# Patient Record
Sex: Male | Born: 1955 | Race: White | Hispanic: No | Marital: Married | Smoking: Never smoker
Health system: Southern US, Community
[De-identification: ages and names within clinical notes are randomized; demographics above are authoritative.]

## PROBLEM LIST (undated history)

## (undated) DIAGNOSIS — G4733 Obstructive sleep apnea (adult) (pediatric): Secondary | ICD-10-CM

## (undated) DIAGNOSIS — I2510 Atherosclerotic heart disease of native coronary artery without angina pectoris: Secondary | ICD-10-CM

## (undated) DIAGNOSIS — E7800 Pure hypercholesterolemia, unspecified: Secondary | ICD-10-CM

## (undated) DIAGNOSIS — G629 Polyneuropathy, unspecified: Secondary | ICD-10-CM

## (undated) DIAGNOSIS — I10 Essential (primary) hypertension: Secondary | ICD-10-CM

## (undated) DIAGNOSIS — I509 Heart failure, unspecified: Secondary | ICD-10-CM

## (undated) DIAGNOSIS — K219 Gastro-esophageal reflux disease without esophagitis: Secondary | ICD-10-CM

## (undated) HISTORY — PX: CORONARY ARTERY BYPASS GRAFT: SHX141

## (undated) HISTORY — PX: CARPAL TUNNEL RELEASE: SHX101

## (undated) HISTORY — PX: HAMMER TOE SURGERY: SHX385

## (undated) HISTORY — PX: TOE SURGERY: SHX1073

## (undated) HISTORY — DX: Polyneuropathy, unspecified: G62.9

## (undated) HISTORY — DX: Atherosclerotic heart disease of native coronary artery without angina pectoris: I25.10

## (undated) HISTORY — DX: Gastro-esophageal reflux disease without esophagitis: K21.9

## (undated) HISTORY — DX: Obstructive sleep apnea (adult) (pediatric): G47.33

## (undated) HISTORY — PX: PACEMAKER INSERTION: SHX728

---

## 1998-05-26 ENCOUNTER — Ambulatory Visit (HOSPITAL_BASED_OUTPATIENT_CLINIC_OR_DEPARTMENT_OTHER): Admission: RE | Admit: 1998-05-26 | Discharge: 1998-05-26 | Payer: Self-pay | Admitting: Orthopedic Surgery

## 1998-08-26 ENCOUNTER — Ambulatory Visit (HOSPITAL_COMMUNITY): Admission: RE | Admit: 1998-08-26 | Discharge: 1998-08-26 | Payer: Self-pay | Admitting: *Deleted

## 1998-09-28 ENCOUNTER — Ambulatory Visit (HOSPITAL_COMMUNITY): Admission: RE | Admit: 1998-09-28 | Discharge: 1998-09-28 | Payer: Self-pay | Admitting: Orthopedic Surgery

## 1998-09-28 ENCOUNTER — Encounter: Payer: Self-pay | Admitting: Orthopedic Surgery

## 1999-03-03 ENCOUNTER — Ambulatory Visit (HOSPITAL_BASED_OUTPATIENT_CLINIC_OR_DEPARTMENT_OTHER): Admission: RE | Admit: 1999-03-03 | Discharge: 1999-03-03 | Payer: Self-pay | Admitting: Orthopedic Surgery

## 1999-07-19 ENCOUNTER — Ambulatory Visit (HOSPITAL_BASED_OUTPATIENT_CLINIC_OR_DEPARTMENT_OTHER): Admission: RE | Admit: 1999-07-19 | Discharge: 1999-07-19 | Payer: Self-pay | Admitting: Orthopedic Surgery

## 1999-11-24 ENCOUNTER — Ambulatory Visit (HOSPITAL_COMMUNITY): Admission: RE | Admit: 1999-11-24 | Discharge: 1999-11-24 | Payer: Self-pay | Admitting: Family Medicine

## 2000-04-25 ENCOUNTER — Encounter: Admission: RE | Admit: 2000-04-25 | Discharge: 2000-07-24 | Payer: Self-pay | Admitting: Internal Medicine

## 2000-05-14 ENCOUNTER — Encounter: Admission: RE | Admit: 2000-05-14 | Discharge: 2000-08-12 | Payer: Self-pay | Admitting: *Deleted

## 2000-10-17 ENCOUNTER — Encounter: Admission: RE | Admit: 2000-10-17 | Discharge: 2000-10-18 | Payer: Self-pay | Admitting: Internal Medicine

## 2001-04-04 ENCOUNTER — Ambulatory Visit (HOSPITAL_BASED_OUTPATIENT_CLINIC_OR_DEPARTMENT_OTHER): Admission: RE | Admit: 2001-04-04 | Discharge: 2001-04-04 | Payer: Self-pay | Admitting: Orthopedic Surgery

## 2007-11-22 ENCOUNTER — Inpatient Hospital Stay (HOSPITAL_BASED_OUTPATIENT_CLINIC_OR_DEPARTMENT_OTHER): Admission: RE | Admit: 2007-11-22 | Discharge: 2007-11-22 | Payer: Self-pay | Admitting: Cardiology

## 2007-11-25 ENCOUNTER — Inpatient Hospital Stay (HOSPITAL_COMMUNITY): Admission: EM | Admit: 2007-11-25 | Discharge: 2007-12-02 | Payer: Self-pay | Admitting: Emergency Medicine

## 2007-11-26 ENCOUNTER — Ambulatory Visit: Payer: Self-pay | Admitting: Surgery

## 2007-11-26 ENCOUNTER — Encounter (INDEPENDENT_AMBULATORY_CARE_PROVIDER_SITE_OTHER): Payer: Self-pay | Admitting: Emergency Medicine

## 2007-11-26 ENCOUNTER — Encounter: Payer: Self-pay | Admitting: Cardiology

## 2007-12-17 ENCOUNTER — Encounter: Admission: RE | Admit: 2007-12-17 | Discharge: 2007-12-17 | Payer: Self-pay | Admitting: Surgery

## 2007-12-24 ENCOUNTER — Ambulatory Visit: Payer: Self-pay | Admitting: Surgery

## 2007-12-26 ENCOUNTER — Encounter (HOSPITAL_COMMUNITY): Admission: RE | Admit: 2007-12-26 | Discharge: 2008-03-25 | Payer: Self-pay | Admitting: Cardiology

## 2008-03-19 ENCOUNTER — Encounter: Payer: Self-pay | Admitting: Internal Medicine

## 2008-03-19 ENCOUNTER — Ambulatory Visit (HOSPITAL_COMMUNITY): Admission: RE | Admit: 2008-03-19 | Discharge: 2008-03-19 | Payer: Self-pay | Admitting: Family Medicine

## 2008-03-26 ENCOUNTER — Encounter (HOSPITAL_COMMUNITY): Admission: RE | Admit: 2008-03-26 | Discharge: 2008-03-28 | Payer: Self-pay | Admitting: Cardiology

## 2008-03-30 ENCOUNTER — Encounter (HOSPITAL_COMMUNITY): Admission: RE | Admit: 2008-03-30 | Discharge: 2008-06-18 | Payer: Self-pay | Admitting: Cardiology

## 2008-04-09 ENCOUNTER — Ambulatory Visit: Payer: Self-pay | Admitting: Internal Medicine

## 2008-04-09 DIAGNOSIS — R071 Chest pain on breathing: Secondary | ICD-10-CM

## 2008-04-09 DIAGNOSIS — R0602 Shortness of breath: Secondary | ICD-10-CM

## 2008-04-09 DIAGNOSIS — E785 Hyperlipidemia, unspecified: Secondary | ICD-10-CM | POA: Insufficient documentation

## 2008-04-09 DIAGNOSIS — I10 Essential (primary) hypertension: Secondary | ICD-10-CM | POA: Insufficient documentation

## 2008-04-17 ENCOUNTER — Ambulatory Visit: Payer: Self-pay | Admitting: Internal Medicine

## 2008-05-18 ENCOUNTER — Encounter: Payer: Self-pay | Admitting: Internal Medicine

## 2008-05-19 ENCOUNTER — Encounter: Payer: Self-pay | Admitting: Cardiology

## 2008-05-19 ENCOUNTER — Encounter: Payer: Self-pay | Admitting: Internal Medicine

## 2008-05-25 ENCOUNTER — Ambulatory Visit: Payer: Self-pay | Admitting: Internal Medicine

## 2008-05-25 DIAGNOSIS — R0609 Other forms of dyspnea: Secondary | ICD-10-CM | POA: Insufficient documentation

## 2008-05-25 DIAGNOSIS — R0989 Other specified symptoms and signs involving the circulatory and respiratory systems: Secondary | ICD-10-CM

## 2008-06-23 ENCOUNTER — Ambulatory Visit (HOSPITAL_BASED_OUTPATIENT_CLINIC_OR_DEPARTMENT_OTHER): Admission: RE | Admit: 2008-06-23 | Discharge: 2008-06-23 | Payer: Self-pay | Admitting: Pulmonary Disease

## 2008-06-23 ENCOUNTER — Ambulatory Visit: Payer: Self-pay | Admitting: Pulmonary Disease

## 2008-06-23 DIAGNOSIS — G4730 Sleep apnea, unspecified: Secondary | ICD-10-CM | POA: Insufficient documentation

## 2008-06-26 ENCOUNTER — Ambulatory Visit: Payer: Self-pay | Admitting: Pulmonary Disease

## 2008-07-30 ENCOUNTER — Encounter: Payer: Self-pay | Admitting: Pulmonary Disease

## 2008-08-07 ENCOUNTER — Telehealth (INDEPENDENT_AMBULATORY_CARE_PROVIDER_SITE_OTHER): Payer: Self-pay | Admitting: *Deleted

## 2008-09-21 ENCOUNTER — Encounter: Payer: Self-pay | Admitting: Internal Medicine

## 2008-10-26 ENCOUNTER — Telehealth (INDEPENDENT_AMBULATORY_CARE_PROVIDER_SITE_OTHER): Payer: Self-pay | Admitting: *Deleted

## 2008-11-18 ENCOUNTER — Ambulatory Visit: Payer: Self-pay | Admitting: Pulmonary Disease

## 2008-12-24 ENCOUNTER — Encounter: Payer: Self-pay | Admitting: Pulmonary Disease

## 2009-03-22 ENCOUNTER — Encounter: Payer: Self-pay | Admitting: Internal Medicine

## 2009-04-28 ENCOUNTER — Ambulatory Visit: Payer: Self-pay | Admitting: Surgery

## 2009-05-04 ENCOUNTER — Encounter: Admission: RE | Admit: 2009-05-04 | Discharge: 2009-05-04 | Payer: Self-pay | Admitting: Cardiology

## 2009-05-06 ENCOUNTER — Inpatient Hospital Stay (HOSPITAL_BASED_OUTPATIENT_CLINIC_OR_DEPARTMENT_OTHER): Admission: RE | Admit: 2009-05-06 | Discharge: 2009-05-06 | Payer: Self-pay | Admitting: Cardiology

## 2009-06-07 ENCOUNTER — Telehealth (INDEPENDENT_AMBULATORY_CARE_PROVIDER_SITE_OTHER): Payer: Self-pay | Admitting: *Deleted

## 2009-06-09 ENCOUNTER — Encounter: Payer: Self-pay | Admitting: Pulmonary Disease

## 2009-08-13 ENCOUNTER — Encounter: Payer: Self-pay | Admitting: Internal Medicine

## 2009-08-17 ENCOUNTER — Ambulatory Visit: Payer: Self-pay | Admitting: Pulmonary Disease

## 2009-10-19 ENCOUNTER — Ambulatory Visit: Payer: Self-pay | Admitting: Pulmonary Disease

## 2009-10-19 ENCOUNTER — Ambulatory Visit: Payer: Self-pay | Admitting: Internal Medicine

## 2009-10-19 DIAGNOSIS — R05 Cough: Secondary | ICD-10-CM

## 2009-10-19 DIAGNOSIS — R059 Cough, unspecified: Secondary | ICD-10-CM | POA: Insufficient documentation

## 2009-12-06 ENCOUNTER — Ambulatory Visit: Payer: Self-pay | Admitting: Internal Medicine

## 2009-12-06 DIAGNOSIS — I2510 Atherosclerotic heart disease of native coronary artery without angina pectoris: Secondary | ICD-10-CM | POA: Insufficient documentation

## 2010-01-17 ENCOUNTER — Encounter: Payer: Self-pay | Admitting: Pulmonary Disease

## 2010-01-21 ENCOUNTER — Encounter: Payer: Self-pay | Admitting: Pulmonary Disease

## 2010-02-02 ENCOUNTER — Encounter (HOSPITAL_COMMUNITY): Admission: RE | Admit: 2010-02-02 | Discharge: 2010-03-18 | Payer: Self-pay | Admitting: Internal Medicine

## 2010-03-19 ENCOUNTER — Encounter (HOSPITAL_COMMUNITY): Admission: RE | Admit: 2010-03-19 | Discharge: 2010-05-18 | Payer: Self-pay | Admitting: Internal Medicine

## 2010-03-21 ENCOUNTER — Ambulatory Visit: Payer: Self-pay | Admitting: Internal Medicine

## 2010-03-22 ENCOUNTER — Encounter: Payer: Self-pay | Admitting: Internal Medicine

## 2010-07-19 NOTE — Assessment & Plan Note (Signed)
Summary: f/u ///kp   Visit Type:  Follow-up Copy to:  Dr. Durwin Burns Primary Provider/Referring Provider:  Dr. Holley Burns PCP, Dr. Laneta Burns CVTS, Dr. Peter Burns Cardiology  CC:  The patient is here for follow-up and says there is no changes in his breathing ....  History of Present Illness: 55/M with metabolic syndrome & moderate obstructive sleep apnea, DM, HTN, CAD s/p CABG x 5 in 6/09.   : IOV 04/09/2008: 55 year old non-smoker male with diabetes, obesity, hypertension, hyperlipidemia. Had CABG x 5 in  June 2009. Subsequently, c/o "hurting to breathe" even the immediate post-op period. However, this resolved a few weeks after surgery but recurred few months ago. Symptom is significant enough he could not complete cardiac rehab yesterday; currently in maintenance phase. Describes a central chest discomfort that is present all the time, including waking him up at night. Worsened by deep inspiration.Relieved by "shallow breathing". Does not radiate. No other trigger or relieving factors including symbicort/advair each for 3 weeks without relief. Currently on spiriva since 04/07/2008.  Symptoms are associated with on and off dyspnea with exertion; notices this in rehab but does not desaturate. Of note, he has not seen Dr. Laneta Burns or Dr. Swaziland about this.  Reports having had pft's a few weeks ago at [facility] and was told he had mild copd. I reviewd this Jordan Burns done on 03/19/2008 and it is essential normal but for redn in [name] - ? CABG effect; Fev1 2.82/80%, FVC 3.6/73%, Ratio 78 (77), Fef 25-75% 2.18/64%, flow-volume loop normal. REC: CXR, Full pft and after stopping all inhalers  OV 57/12/2007: S: Here to review tests ordered last visit. Reports symptoms are better since last visit. Has seen Dr. Swaziland last week who reportely told him it was "body and heart readjusting after CABG and was reassrred". He is leaning towards expectantly following this. Also, he is admitting today that he snores a lot  and has xs daytime somnolence and has weird sleep schedule (wakes up at 1am) due to job. He is amenable for sleep eval O: CXR 10/22//2009  - normal. PFTs normal on 04/17/2008. Had - walked 456 meters wihthout desaturation. Dyspnea Score was 3 at baseline and end of test.   OV Dr Jordan Burns 57/06/2009: Moderate OSA. RDI 14. Started CPAP.        Oct 19, 2009--Presents for work in visit. Complains over last 6 months of increased SOB, occ wheezing, dry cough. Dry cough is aggravating, tickling cough. Breathing is not as good, wears out easily. Has a whistling sound in throat esp at night. Denies chest pain,  orthopnea, hemoptysis, fever, n/v/d, edema, headache.     REC:  CXR - clear Jordan Burns - shows restriction. Fev1 2.27L/57%, FVC 2.74L/53%, Ratio 82 Change Lisinopril to ARB, STop Fish ol.  OV 12/06/2009: Dyspnea followup. NOt seen him since dec 2009. States that dyspnea severity has progressed esp past 1 year. DYspnea poses a 'constant struggle". Makes him "heck a lot tired". STates any exertion makes him dyspneic; esp trying to [place] or walking. He does have associated chest "hiurting" and also states that wife has noted him to have wheezed on occassion. Dr Burns did evaluate him for angina equivalent in Feb 2011. 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 Stil has cough but this is mild-moderate and stable. It comes and goes at random.  Present for > 6 months. Changing ACE inhbitior to ARB  and stopping fish oil have not  helped. No other symptoms other than edema.  REC: WEIGHT LOSS, STRICT DIET, PULMONARY REHAB       March 21, 2010: Followup dyspnea due to obesity ad angina equivalent.  Since last visit in june 2011 he has lost 11# via diet. Has been attending pulmonary rehab and dyspnea is resolved. FEels well. going for walks.  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 No cough. No new problems.   Preventive Screening-Counseling & Management  Alcohol-Tobacco     Smoking Status: never  Current Medications (verified): 1)  Metformin Hcl 1000 Mg Tabs (Metformin Hcl) .... Take 1 Tablet By Mouth Two Times A Day 2)  Metoprolol Succinate 50 Mg Xr24h-Tab (Metoprolol Succinate) .... Once Daily 3)  Simvastatin 80 Mg Tabs (Simvastatin) .... Once Daily 4)  Lantus 100 Unit/ml Soln (Insulin Glargine) .... 76 Units Two Times A Day 5)  Novolog 100 Unit/ml Soln (Insulin Aspart) .... Sliding Scale With Each Meal 6)  Furosemide 40 Mg Tabs (Furosemide) .... Take 1 Tablet By Mouth Once A Day 7)  Isosorbide Mononitrate Cr 60 Mg Xr24h-Tab (Isosorbide Mononitrate) .... Take 1 Tablet By Mouth Two Times A Day 8)  Fish Oil 1000 Mg Caps (Omega-3 Fatty Acids) .... Take 1 Capsule By Mouth Three Times A Day 9)  Nitrostat 0.4 Mg Subl (Nitroglycerin) .Marland Kitchen.. 1 Under Tongue Every 5 Minutes X3 As Needed Chest Pain 10)  Losartan Potassium 100 Mg Tabs (Losartan Potassium) .Marland Kitchen.. 1 By Mouth Daily 11)  Buspirone Hcl 10 Mg Tabs (Buspirone Hcl) .Marland Kitchen.. 1 By Mouth Three Times A Day 12)  Bupropion Hcl 100 Mg Xr12h-Tab (Bupropion Hcl) .... 2 By Mouth Two Times A Day 13)  Multivitamins  Tabs (Multiple Vitamin) .Marland Kitchen.. 1 By Mouth Daily  Allergies (verified): 1)  ! Iodine  Past History:  Past medical, surgical, family and social histories (including risk factors) reviewed, and no changes noted (except as noted below).  Past Medical History: Reviewed history from 06/23/2008 and no changes required. Diabetes Hyperlipidemia Hypertension CAD - sp CABG June 2009 Mild GERD - currently off medicines Obesity - BMI 32  Past Surgical History: Reviewed history from 04/09/2008 and no changes required. CABG x 5 - June 2009  Family History: Reviewed history from 04/09/2008 and no changes required. heart disease- mother and father  Social History: Reviewed history from 06/23/2008 and  no changes required. 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    Vital Signs:  Patient profile:   55 year old male Height:      72 inches (182.88 cm) Weight:      272 pounds (123.64 kg) BMI:     37.02 O2 Sat:      95 % on Room air Temp:     97.8 degrees F (36.56 degrees C) oral Pulse rate:   66 / minute  BP sitting:   112 / 70  (left arm) Cuff size:   large  O2 Sat at Rest %:  95 O2 Flow:  Room air CC: The patient is here for follow-up and says there is no changes in his breathing ... Is Patient Diabetic? Yes Comments Medications reviewed with patient Daytime phone verified. Michel Bickers [facility]  March 21, 2010 1:44 PM   Physical Exam  General:  obese.  well developed, well nourished, in no acute distress Head:  normocephalic and atraumatic Eyes:  PERRLA/EOM intact; conjunctiva and sclera clear Ears:  TMs intact and clear with normal canals Nose:  no deformity, discharge, inflammation, or lesions Mouth:  no deformity or lesions Neck:  no masses, thyromegaly, or abnormal cervical nodes Chest Wall:  scar of CABG + Lungs:  clear bilaterally to auscultation and percussion Heart:  regular rate and rhythm, S1, S2 without murmurs, rubs, gallops, or  clicks Abdomen:  bowel sounds positive; abdomen soft and non-tender without masses, or organomegaly Msk:  no deformity or scoliosis noted with normal posture Pulses:  pulses normal Extremities:  no clubbing, cyanosis, edema, or deformity noted Neurologic:  CN II-XII grossly intact with normal reflexes, coordination, muscle strength and tone Skin:  intact without lesions or rashes Cervical Nodes:  no significant adenopathy Axillary Nodes:  no significant adenopathy Psych:  alert and cooperative; normal mood and affect; normal attention span and concentration   Impression & Recommendations:  Problem # 1:  COUGH (ICD-786.2) Assessment Improved  now resolved  plan monitor  Orders: Est. Patient Level II (16109)  Problem # 2:  SHORTNESS OF BREATH (SOB) (ICD-786.05) Assessment: Improved  Due to angina equivalent, obesity and deconditioning. He is better after 11# weight loss and attending pulmonary rehab. Encouraged to follow near vegan diet and continued weight loss. Advised to get personal fitness  trainer if he can afford it. FU 1 year  Orders: Est. Patient Level II (60454)  Medications Added to Medication List This Visit: 1)  Lantus 100 Unit/ml Soln (Insulin glargine) .... 76 units two times a day 2)  Losartan Potassium 100 Mg Tabs (Losartan potassium) .Marland Kitchen.. 1 by mouth daily 3)  Buspirone Hcl 10 Mg Tabs (Buspirone hcl) .Marland Kitchen.. 1 by mouth three times a day 4)  Bupropion Hcl 100 Mg Xr12h-tab (Bupropion hcl) .... 2 by mouth two times a day 5)  Multivitamins Tabs (Multiple vitamin) .Marland Kitchen.. 1 by mouth daily  Patient Instructions: 1)  followup 1 year 2)  if you can afford it get a fitness /nutrition trainer and keep going on with weight loss 3)  come sooner if there are problems   Immunization History:  Influenza Immunization History:    Influenza:  historical (03/14/2010)

## 2010-07-19 NOTE — Letter (Signed)
Summary: [facility]   Imported By: Sherian Rein 09/16/2009 07:06:38  _____________________________________________________________________  External Attachment:    Type:   Image     Comment:   External Document

## 2010-07-19 NOTE — Letter (Signed)
Summary: [facility]N/[facility]  LMN/[facility]   Imported By: Lester [place] 01/21/2010 14:25:39  _____________________________________________________________________  External Attachment:    Type:   Image     Comment:   External Document

## 2010-07-19 NOTE — Assessment & Plan Note (Signed)
Summary: 1 MONTH/ MBW   Visit Type:  Follow-up Copy to:  Dr. Durwin Nora Primary Provider/Referring Provider:  [name] PCP, [name] CVTS, [name] Cardiology  CC:  1 month followup per TP.  Pt states that his breathing is worse since last seen.  He states that he gets SOB with or without any exertion.  Cough is better- still has occ dry cough- mainly in the am.  He states that he took 1 month supply of diovan and this did not help so went back on lisinopril.  Jordan Burns  History of Present Illness: 55/M with metabolic syndrome & moderate obstructive sleep apnea, DM, HTN, CAD s/p CABG x 5 in 6/09.   : IOV 04/09/2008: 55 year old non-smoker male with diabetes, obesity, hypertension, hyperlipidemia. Had CABG x 5 in  June 2009. Subsequently, c/o "hurting to breathe" even the immediate post-op period. However, this resolved a few weeks after surgery but recurred few months ago. Symptom is significant enough he could not complete cardiac rehab yesterday; currently in maintenance phase. Describes a central chest discomfort that is present all the time, including waking him up at night. Worsened by deep inspiration.Relieved by "shallow breathing". Does not radiate. No other trigger or relieving factors including symbicort/advair each for 3 weeks without relief. Currently on spiriva since 04/07/2008.  Symptoms are associated with on and off dyspnea with exertion; notices this in rehab but does not desaturate. Of note, he has not seen [name] or Dr. Sw[place] about this.  Reports having had pft's a few weeks ago at [facility] and was told he had mild copd. I reviewd this [name] done on 03/19/2008 and it is essential normal but for redn in [facility] - ? CABG effect; Fev1 2.82/80%, FVC 3.6/73%, Ratio 78 (77), Fef 25-75% 2.18/64%, flow-volume loop normal. REC: CXR, Full pft and after stopping all inhalers  OV 05/25/2008: S: Here to review tests ordered last visit. Reports symptoms are better since last visit.  Has seen Dr. Swaziland last week who reportely told him it was "body and heart readjusting after CABG and was reassrred". He is leaning towards expectantly following this. Also, he is admitting today that he snores a lot and has xs daytime somnolence and has weird sleep schedule (wakes up at 1am) due to job. He is amenable for sleep eval O: CXR 10/22//2009  - normal. PFTs normal on 04/17/2008. Had - walked 456 meters wihthout desaturation. Dyspnea Score was 3 at baseline and end of test.   OV [name] 08/17/2009: Moderate OSA. RDI 14. Started CPAP.        Oct 19, 2009--Presents for work in visit. Complains over last 6 months of increased SOB, occ wheezing, dry cough. Dry cough is aggravating, tickling cough. Breathing is not as good, wears out easily. Has a whistling sound in throat esp at night. Denies chest pain,  orthopnea, hemoptysis, fever, n/v/d, edema, headache.     REC:  CXR - clear [name] - shows restriction. Fev1 2.27L/57%, FVC 2.74L/53%, Ratio 82 Change Lisinopril to ARB, STop Fish ol.  OV 12/06/2009: Dyspnea followup. NOt seen him since dec 2009. States that dyspnea severity has progressed esp past 1 year. DYspnea poses a 'constant struggle". Makes him "heck a lot tired". STates any exertion makes him dyspneic; esp trying to [place] or walking. He does have associated chest "hiurting" and also states that wife has noted him to have wheezed on occassion. [name] did evaluate him for angina equivalent  in Feb 2011. 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 Stil has cough but this is mild-moderate and stable. It comes and goes at random. Present for > 6 months. Changing ACE inhbitior to ARB  and stopping fish oil have not  helped. No other symptoms other than edema.     "  Preventive Screening-Counseling &  Management  Alcohol-Tobacco     Smoking Status: never  Current Medications (verified): 1)  Metformin Hcl 1000 Mg Tabs (Metformin Hcl) .... Take 1 Tablet By Mouth Two Times A Day 2)  Sertraline Hcl 100 Mg Tabs (Sertraline Hcl) .... Once Daily 3)  Metoprolol Succinate 50 Mg Xr24h-Tab (Metoprolol Succinate) .... Once Daily 4)  Simvastatin 80 Mg Tabs (Simvastatin) .... Once Daily 5)  Lisinopril 10 Mg Tabs (Lisinopril) .Jordan Burns.. 1 Once Daily 6)  Vitamin B-12 1000 Mcg Tabs (Cyanocobalamin) .... Once Daily 7)  Lantus 100 Unit/ml Soln (Insulin Glargine) .... 74 Units A Day 8)  Novolog 100 Unit/ml Soln (Insulin Aspart) .... Sliding Scale With Each Meal 9)  Furosemide 40 Mg Tabs (Furosemide) .... Take 1 Tablet By Mouth Once A Day 10)  Isosorbide Mononitrate Cr 60 Mg Xr24h-Tab (Isosorbide Mononitrate) .... Take 1 Tablet By Mouth Two Times A Day 11)  Fish Oil 1000 Mg Caps (Omega-3 Fatty Acids) .... Take 1 Capsule By Mouth Three Times A Day 12)  Nitrostat 0.4 Mg Subl (Nitroglycerin) .Jordan Burns.. 1 Under Tongue Every 5 Minutes X3 As Needed Chest Pain  Allergies (verified): 1)  ! 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 Diabetes Hyperlipidemia Hypertension CAD - sp CABG June 2009 Mild GERD - currently off medicines Obesity - BMI 32  Past Surgical History: Reviewed history from 04/09/2008 and no changes required. CABG x 5 - June 2009  Family History: Reviewed history from 04/09/2008 and no changes required. heart  disease- mother and father  Social History: Reviewed history from 06/23/2008 and no changes required. 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  The patient denies productive cough, coughing up blood, irregular heartbeats, acid heartburn, indigestion, loss of appetite, weight change, abdominal pain, difficulty swallowing, sore throat, tooth/dental problems, headaches, nasal congestion/difficulty breathing through nose, sneezing, itching, ear ache, anxiety, depression, rash, change in color of mucus, and fever.    Vital Signs:  Patient profile:   55 year old male Weight:      283 pounds BMI:     38.52 O2 Sat:      96 % on Room air Temp:     97.4 degrees F oral Pulse rate:   69 /  minute BP sitting:   110 / 68  (left arm)  Vitals Entered By: Vernie Murders (December 06, 2009 9:53 AM)  O2 Flow:  Room air  Physical Exam  General:  obese.  well developed, well nourished, in no acute distress Head:  normocephalic and atraumatic Eyes:  PERRLA/EOM intact; conjunctiva and sclera clear Ears:  TMs intact and clear with normal canals Nose:  no deformity, discharge, inflammation, or lesions Mouth:  no deformity or lesions Neck:  no masses, thyromegaly, or abnormal cervical nodes Chest Wall:  scar of CABG + Lungs:  clear bilaterally to auscultation and percussion Heart:  regular rate and rhythm, S1, S2 without murmurs, rubs, gallops, or clicks Abdomen:  bowel sounds positive; abdomen soft and non-tender without masses, or organomegaly Msk:  no deformity or scoliosis noted with normal  posture Pulses:  pulses normal Extremities:  no clubbing, cyanosis, edema, or deformity noted Neurologic:  CN II-XII grossly intact with normal reflexes, coordination, muscle strength and tone Skin:  intact without lesions or rashes Cervical Nodes:  no significant adenopathy Axillary Nodes:  no significant adenopathy Psych:  alert and cooperative; normal mood and affect; normal attention span and concentration   Impression & Recommendations:  Problem # 1:  SHORTNESS OF BREATH (SOB) (ICD-786.05) Assessment Deteriorated Pulmonary workup so fare negative. CArdiac workup suggests angina equivalent as etiology for dyspnea. Obesity could be playig a role as well. I do not see utility in getting CPST. He needs to lose weight. REcommened following Danielle Dess and Jackolyn Confer diets to help lose weight and potentially reverse atherosclerosis. REferrred him to rehab. There is very little else to offer as of now. I will see him 4-5 months to monitor progress Orders: Rehabilitation Referral (Rehab) Est. Patient Level III (04540)  Problem # 2:  COUGH (ICD-786.2) Assessment: Unchanged  Chaning ACE inhbitor to ARB and stopping fish oil have not helped with cough. GERD is likely scenarior. In any wevent cough is mild. Advised GERD control. Will reassess at fu.   Orders: Est. Patient Level III ([id number])  Problem # 3:  OBSTRUCTIVE SLEEP APNEA (ICD-780.57) Assessment: Comment Only per [name] Needs to lose weight Orders: Rehabilitation Referral (Rehab) Est. Patient Level III ([id number])  Medications Added to Medication List This Visit: 1)  Lisinopril 10 Mg Tabs (Lisinopril) .Jordan Burns.. 1 once daily  Patient Instructions: 1)  attend pulmonary rehab for shortness of breath 2)  read Danielle Dess and Luster Landsberg diets 3)  Join weight watchers 4)  I think it is your heart and weight causing shortness of breath 5)  return in 4-5 months to report progress 6)  you really really need to get serious about  your diet and weight

## 2010-07-19 NOTE — Letter (Signed)
Summary: [facility]  CMN/[facility]   Imported By: Lester Alice 01/27/2010 08:24:50  _____________________________________________________________________  External Attachment:    Type:   Image     Comment:   External Document

## 2010-07-19 NOTE — Assessment & Plan Note (Signed)
Summary: Acute NP office visit - dyspnea   Copy to:  Dr. Durwin Burns Primary Provider/Referring Provider:  Dr. Holley Burns PCP, Dr. Laneta Burns CVTS, Dr. Peter Burns Cardiology  CC:  increased SOB, occ wheezing, dry cough x63months - denies chest congestion, head congestion, and f/c/s.  History of Present Illness: 53/M with metabolic syndrome & moderate obstructive sleep apnea, DM, HTN, CAD s/p CABG x 5 in 6/09.   : IOV 04/09/2008: 55 year old non-smoker male with diabetes, hypertension, hyperlipidemia. Had CABG x 5 in  June 2009. Subsequently, c/o "hurting to breathe" even the immediate post-op period. However, this resolved a few weeks after surgery but recurred few months ago. Symptom is significant enough he could not complete cardiac rehab yesterday; currently in maintenance phase. Describes a central chest discomfort that is present all the time, including waking him up at night. Worsened by deep inspiration.Relieved by "shallow breathing". Does not radiate. No other trigger or relieving factors including symbicort/advair each for 3 weeks without relief. Currently on spiriva since 04/07/2008.  Symptoms are associated with on and off dyspnea with exertion; notices this in rehab but does not desaturate. Of note, he has not seen Dr. Laneta Burns or Dr. Swaziland about this.  Reports having had pft's a few weeks ago at [facility] and was told he had mild copd. I reviewd this Jordan Burns done on 03/19/2008 and it is essential normal but for redn in [facility] - ? CABG effect; Fev1 2.82/80%, FVC 3.6/73%, Ratio 78 (77), Fef 25-75% 2.18/64%, flow-volume loop normal. REC: CXR, Full pft and after stopping all inhalers  OV 05/25/2008: S: Here to review tests ordered last visit. Reports symptoms are better since last visit. Has seen Dr. Swaziland last week who reportely told him it was "body and heart readjusting after CABG and was reassrred". He is leaning towards expectantly following this. Also, he is admitting today that he snores a  lot and has xs daytime somnolence and has weird sleep schedule (wakes up at 1am) due to job. He is amenable for sleep eval O: CXR 10/22//2009  - normal. PFTs normal on 04/17/2008. Had - walked 456 meters wihthout desaturation. Dyspnea Score was 3 at baseline and end of test.        Oct 19, 2009--Presents for work in visit. Complains over last 6 months of increased SOB, occ wheezing, dry cough. Dry cough is aggravating, tickling cough. Breathing is not as good, wears out easily. Has a whistling sound in throat esp at night. Denies chest pain,  orthopnea, hemoptysis, fever, n/v/d, edema, headache.       Medications Prior to Update: 1)  Metformin Hcl 1000 Mg Tabs (Metformin Hcl) .... Take 1 Tablet By Mouth Two Times A Day 2)  Sertraline Hcl 100 Mg Tabs (Sertraline Hcl) .... Once Daily 3)  Metoprolol Succinate 50 Mg Xr24h-Tab (Metoprolol Succinate) .... Once Daily 4)  Simvastatin 80 Mg Tabs (Simvastatin) .... Once Daily 5)  Lisinopril 10 Mg Tabs (Lisinopril) .... Once Daily 6)  Vitamin B-12 1000 Mcg Tabs (Cyanocobalamin) .... Once Daily 7)  Lantus 100 Unit/ml Soln (Insulin Glargine) .... 74 Units A Day 8)  Novolog 100 Unit/ml Soln (Insulin Aspart) .... 60 Units Three Times A Day/sliding Scale 9)  Furosemide 40 Mg Tabs (Furosemide) .... Take 1 Tablet By Mouth Once A Day 10)  Isosorbide Mononitrate Cr 60 Mg Xr24h-Tab (Isosorbide Mononitrate) .... Take 1 Tablet By Mouth Two Times A Day 11)  Fish Oil 1000 Mg Caps (Omega-3 Fatty Acids) .... Take  1 Capsule By Mouth Three Times A Day 12)  Nitrostat 0.4 Mg Subl (Nitroglycerin) .... As Needed  Current Medications (verified): 1)  Metformin Hcl 1000 Mg Tabs (Metformin Hcl) .... Take 1 Tablet By Mouth Two Times A Day 2)  Sertraline Hcl 100 Mg Tabs (Sertraline Hcl) .... Once Daily 3)  Metoprolol Succinate 50 Mg Xr24h-Tab (Metoprolol Succinate) .... Once Daily 4)  Simvastatin 80 Mg Tabs (Simvastatin) .... Once Daily 5)  Lisinopril 10 Mg Tabs  (Lisinopril) .... Once Daily 6)  Vitamin B-12 1000 Mcg Tabs (Cyanocobalamin) .... Once Daily 7)  Lantus 100 Unit/ml Soln (Insulin Glargine) .... 74 Units A Day 8)  Novolog 100 Unit/ml Soln (Insulin Aspart) .... Sliding Scale With Each Meal 9)  Furosemide 40 Mg Tabs (Furosemide) .... Take 1 Tablet By Mouth Once A Day 10)  Isosorbide Mononitrate Cr 60 Mg Xr24h-Tab (Isosorbide Mononitrate) .... Take 1 Tablet By Mouth Two Times A Day 11)  Fish Oil 1000 Mg Caps (Omega-3 Fatty Acids) .... Take 1 Capsule By Mouth Three Times A Day 12)  Nitrostat 0.4 Mg Subl (Nitroglycerin) .Marland Kitchen.. 1 Under Tongue Every 5 Minutes X3 As Needed Chest Pain  Allergies (verified): 1)  ! 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  Risk Factors: Smoking Status: never (04/09/2008)  Review of Systems      See HPI  Vital Signs:  Patient profile:   55 year old male Height:      72 inches Weight:      280.38 pounds BMI:     38.16 O2 Sat:      96 % on Room air Pulse rate:   75 / minute BP sitting:   122 / 62  (left arm) Cuff size:   large  Vitals Entered By: Jordan Master CNA (Oct 19, 2009 10:09 AM)  O2 Flow:  Room air  Serial Vital Signs/Assessments:  Comments: 11:06 AM Ambulatory Pulse Oximetry  Resting; HR_86____    02 Sat__96___  Lap1 (185 feet)   HR__95___   02 Sat_93____ Lap2 (185 feet)   HR__97___   02 Sat__92___     Lap3 (185 feet)   HR__100___   02 Sat__94___  _x__Test Completed without Difficulty ___Test Stopped due to:  Jordan Buba RN  Oct 19, 2009 11:07 AM  By: Jordan Buba RN   CC: increased SOB, occ wheezing, dry cough x18months - denies chest congestion, head congestion, f/c/s Is Patient Diabetic? Yes Comments Medications reviewed with patient Daytime contact number verified with patient. Jordan Master CNA  Oct 19, 2009 10:10 AM    Physical Exam  Additional Exam:  Gen. Pleasant, well-nourished, in no distress, obese ENT - no lesions, no post nasal drip Neck: No JVD, no thyromegaly, no carotid bruits Lungs: no use of accessory muscles, no dullness to percussion, clear without rales or rhonchi , diminshed in bases  Cardiovascular: Rhythm regular, heart sounds  normal, no murmurs or gallops, no peripheral edema Musculoskeletal: No deformities, no  cyanosis or clubbing      Pulmonary Function Test Date: 10/19/2009 10:49 AM Gender: Male  Pre-Spirometry FVC    Value: 2.74 L/min   % Pred: 52.60 % FEV1    Value: 2.27 L     Pred: 3.99 L     % Pred: 56.90 % FEV1/FVC  Value: 82.82 %     % Pred: 108 %  Impression & Recommendations:  Problem # 1:  COUGH (ICD-786.2)  6 month hx of cough and dyspnea- suspect this multifactoral in nature He is very overweight, deconditioned. There is no significant desaturation w/ walking today in office.  He is on an ACE Inhibitor which could be contributing to the cough, may also have component of GERD Spriometry does show decreased FEV1 compared to previous however may be d/t upper airway irritation would repeat spiro on return, also xray pending today.  REC:  Stop Lisinopril.  Begin Diovan 160mg  once daily  Hold fish oil until seen back in office . Begin Dexilant 60mg  once daily -finish samples, when done begin Prilosec 20mg  once daily (over the counter)- this is to protect airway from acid in stomach.  Advance activity slowly as tolerated.    Write down how far you can walk today on paper,  keep track of distance each week.  Please contact office for sooner follow up if symptoms do not improve or worsen  follow up 1 month Dr. Marchelle Gearing and as needed   Orders: Est. Patient Level IV (78242)  Medications Added to Medication List This Visit: 1)  Diovan 160 Mg Tabs (Valsartan) .Marland Kitchen.. 1 by mouth once daily 2)  Novolog 100 Unit/ml Soln (Insulin aspart) .... Sliding scale with each meal 3)  Nitrostat 0.4 Mg Subl (Nitroglycerin) .Marland Kitchen.. 1 under tongue every 5 minutes x3 as needed chest pain  Complete Medication List: 1)  Metformin Hcl 1000 Mg Tabs (Metformin hcl) .... Take 1 tablet by mouth two times a day 2)  Sertraline Hcl 100 Mg Tabs (Sertraline hcl) .... Once daily 3)  Metoprolol Succinate 50 Mg Xr24h-tab (Metoprolol succinate) .... Once daily 4)  Simvastatin 80 Mg Tabs (Simvastatin) .... Once daily 5)  Diovan 160 Mg Tabs (Valsartan) .Marland Kitchen.. 1 by mouth once daily 6)  Vitamin B-12 1000 Mcg Tabs (Cyanocobalamin) .... Once daily 7)  Lantus 100 Unit/ml Soln (Insulin glargine) .... 74 units a day 8)  Novolog 100 Unit/ml Soln (Insulin aspart) .... Sliding scale with each meal 9)  Furosemide 40 Mg Tabs (Furosemide) .... Take 1 tablet by mouth once a day 10)  Isosorbide Mononitrate Cr 60 Mg Xr24h-tab (Isosorbide mononitrate) .... Take 1 tablet by mouth two times a day 11)  Fish Oil 1000 Mg Caps (Omega-3 fatty acids) .... Take 1 capsule by mouth three times a day 12)  Nitrostat 0.4 Mg Subl (Nitroglycerin) .Marland Kitchen.. 1 under tongue every 5 minutes x3 as needed chest pain  Other Orders: T-2 View CXR ([id number])  Patient Instructions: 1)  Stop Lisinopril.  2)  Begin Diovan 160mg  once daily  3)  Hold fish oil until seen back in office . 4)  Begin Dexilant 60mg  once daily -finish samples, when done begin Prilosec 20mg  once daily (over the counter)- this is to protect airway from acid in stomach.  5)  Advance activity slowly as tolerated.  6)  Write  down how far you can walk today on paper,  keep track of distance each week.  7)  Please contact office for sooner follow up if symptoms do not improve or  worsen  8)  follow up 1 month Dr. Marchelle Gearing and as needed      CardioPerfect Spirometry  ID: [id number] Patient: Jordan Burns, Jordan Burns DOB: 12-Feb-1956 Age: 55 Years Old Sex: Male Race: White Physician: parrett,tammy Height: 72 Weight: 280.38 Smoker: No Has Pacemaker? No Status: Unconfirmed Past Medical History:  Diabetes Hyperlipidemia Hypertension CAD - sp CABG June 2009 Mild GERD - currently off medicines Obesity - BMI 32  Recorded: 10/19/2009 10:49 AM  Parameter  Measured Predicted %Predicted FVC     2.74        5.20        52.60 FEV1     2.27        3.99        56.90 FEV1%   82.82        76.70        108 PEF    6.13        9.93        61.70   Interpretation:

## 2010-07-19 NOTE — Assessment & Plan Note (Signed)
Summary: 9 months/apc   Visit Type:  Follow-up Copy to:  Dr. Durwin Nora Primary Provider/Referring Provider:  [name] PCP, [name] CVTS, [name] Cardiology  CC:  Pt here for follow up. Pt c/o fluid retention. Pt states is using CPAP every night and is snoring with same. Pt c/o increased S.O.B with activity and rest.  History of Present Illness: 55/M with metabolic syndrome & moderate obstructive sleep apnea .  Epworth Sleepiness Score 12/24, runs paper route , 'Insomnia ' x 15 yrs , ' might as well make use of it', Bedtime 9p, wakes up at 1 am & sleeps again at 6A, stays in bed until 9A, wakes up with dry mouth. 2 cups coffee/d, frequent daytime naps. Always napped since he was a child  reviewed PSG 1/5 >> mild obstructive sleep apnea RDI 14/h, corrected by CPAP, med FF mask 12 cm 5/10 increased CPAP to 14 cm for snoring, good compliance on 12 cm, 1/12 to 2/11 6/10 >> Gained 20 lbs since last OV, HbA1c 8.8, lantus increased. Mask OK, presure OK, exhaled air bothers wife, reports good compliance. Sleep pattern remains erratic due to paper route.  August 17, 2009 1:49 PM  c/o fluid retention. Pt states is using CPAP every night and is snoring with same. Pt c/o increased S.O.B with activity and rest. downlaod 11/22 -12/22 reviewed  - shows good complaince on pr 14 cm, good control of events with residual ahi 3.5/h, but mild leak, perhaps this is what wife is hearing.   Current Medications (verified): 1)  Metformin Hcl 1000 Mg Tabs (Metformin Hcl) .... Take 1 Tablet By Mouth Two Times A Day 2)  Sertraline Hcl 100 Mg Tabs (Sertraline Hcl) .... Once Daily 3)  Metoprolol Succinate 50 Mg Xr24h-Tab (Metoprolol Succinate) .... Once Daily 4)  Simvastatin 80 Mg Tabs (Simvastatin) .... Once Daily 5)  Lisinopril 10 Mg Tabs (Lisinopril) .... Once Daily 6)  Vitamin B-12 1000 Mcg Tabs (Cyanocobalamin) .... Once Daily 7)  Lantus 100 Unit/ml Soln (Insulin Glargine) .... 74 Units  A Day 8)  Novolog 100 Unit/ml Soln (Insulin Aspart) .... 60 Units Three Times A Day/sliding Scale 9)  Furosemide 40 Mg Tabs (Furosemide) .... Take 1 Tablet By Mouth Once A Day 10)  Isosorbide Mononitrate Cr 60 Mg Xr24h-Tab (Isosorbide Mononitrate) .... Take 1 Tablet By Mouth Two Times A Day 11)  Fish Oil 1000 Mg Caps (Omega-3 Fatty Acids) .... Take 1 Capsule By Mouth Three Times A Day 12)  Nitrostat 0.4 Mg Subl (Nitroglycerin) .... As Needed  Allergies (verified): 1)  ! 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    Vital Signs:  Patient profile:   55 year old male Height:      72 inches Weight:  279.50 pounds BMI:     38.04 O2 Sat:      94 % on Room air Temp:     97.7 degrees F oral Pulse rate:   77 / minute BP sitting:   118 / 70  (left arm) Cuff size:   large  Vitals Entered By: Zackery Barefoot CMA (August 17, 2009 1:31 PM)  O2 Flow:  Room air CC: Pt here for follow up. Pt c/o fluid retention. Pt states is using CPAP every night and is snoring with same. Pt c/o increased S.O.B with activity and  rest Comments Medications reviewed with patient Verified contact number and pharmacy with pt Zackery Barefoot CMA  August 17, 2009 1:31 PM    Physical Exam  Additional Exam:  Gen. Pleasant, well-nourished, in no distress ENT - no lesions, no post nasal drip Neck: No JVD, no thyromegaly, no carotid bruits Lungs: no use of accessory muscles, no dullness to percussion, clear without rales or rhonchi  Cardiovascular: Rhythm regular, heart sounds  normal, no murmurs or gallops, no peripheral edema Musculoskeletal: No deformities, no cyanosis or clubbing      Impression & Recommendations:  Problem # 1:  OBSTRUCTIVE SLEEP APNEA (ICD-780.57)  Compliance encouraged, wt loss emphasized, asked to avoid meds with sedative side effects, cautioned against driving when sleepy.  ct cpap 14 cm - no change in settings Advised mask adjustment for leak  Orders: Est. Patient Level III (16109)  Problem # 2:  EXCESSIVE TIREDNESS MAY be related to his variable & interrupted sleep cycles due to running the paper route or to his metabolic problems & medications  Medications Added to Medication List This Visit: 1)  Lantus 100 Unit/ml Soln (Insulin glargine) .... 74 units a day 2)  Novolog 100 Unit/ml Soln (Insulin aspart) .... 60 units three times a day/sliding scale 3)  Furosemide 40 Mg Tabs (Furosemide) .... Take 1 tablet by mouth once a day 4)  Isosorbide Mononitrate Cr 60 Mg Xr24h-tab (Isosorbide mononitrate) .... Take 1 tablet by mouth two times a day 5)  Fish Oil 1000 Mg Caps (Omega-3 fatty acids) .... Take 1 capsule by mouth three times a day 6)  Nitrostat 0.4 Mg Subl (Nitroglycerin) .... As needed  Patient Instructions: 1)  Please schedule a follow-up appointment in 1 year. 2)  No change in settings for now. 3)  Call if snoring persists   Immunization History:  Influenza Immunization History:    Influenza:  historical (04/19/2009)  Pneumovax Immunization History:    Pneumovax:   historical (08/19/2007)

## 2010-11-01 NOTE — Discharge Summary (Signed)
NAMEBRION, SOSSAMON               ACCOUNT NO.:  [id number]   MEDICAL RECORD NO.:  [id number]          PATIENT TYPE:  INP   LOCATION:  2015                         FACILITY:  [facility]   PHYSICIAN:  Evelene Croon, M.D.     DATE OF BIRTH:  [birth year]   DATE OF ADMISSION:  11/25/2007  DATE OF DISCHARGE:  12/01/2007                               DISCHARGE SUMMARY   ADMITTING DIAGNOSES:  1. Multivessel coronary artery disease (with ejection fraction of      55%).  2. History of insulin-dependent diabetes mellitus.  3. History of hypertension.  4. History of hypercholesterolemia.  5. History of peripheral neuropathy.   DISCHARGE DIAGNOSES:  1. Multivessel coronary artery disease (with ejection fraction of      55%).  2. History of insulin-dependent diabetes mellitus.  3. History of hypertension.  4. History of hypercholesterolemia.  5. History of peripheral neuropathy.   PROCEDURES:  1. Cardiac catheterization performed by Dr. Swaziland on November 22, 2007.  2. Aortocoronary bypass x 5 (LIMA to LAD, SVG to diagonal 1, SVG to      OM2, sequential SVG to the posterior lateral branch of the RCA and      distal left circumflex coronary artery with endoscopic vein harvest      from bilateral lower extremities by [name] on August 27, 2007.   BRIEF HOSPITAL COURSE STAY:  This is a 55 year old Caucasian male with a  history of insulin-dependent diabetes mellitus, hypertension, and  hypercholesterolemia.  He presented with a 49-month history of right  substernal and right-sided chest pain.  These symptoms have worsened  recently and have also began occurring at rest.  Cardiac catheterization  was performed by Dr. Swaziland, which showed a dual LAD system with a 70%  stenosis in the septal portion and 50% stenosis in the diagonal portion.  The left circumflex in a 95% mid vessel stenosis.  A small first  marginal and large second marginal that was diffusely diseased.  The  right coronary artery had a  99% proximal stenosis followed by a total  occlusion.  The PDA also appeared diffusely diseased.  It should be  noted that the patient was originally scheduled to see [name] in the  office as an outpatient.  However, he presented to [facility] with  unstable angina.  His CPK was 297 with an MB of 7.  His initial troponin  was 0.01.  EKG showed nonspecific T-wave abnormalities.  He was  originally placed on IV heparin and nitroglycerin.  His chest pain  initially resolved.  However, he awake in the morning of August 27, 2007  with complaints of chest pain and therefore, underwent the  aforementioned aortocoronary bypass surgery.  Postoperatively, he  remained hemodynamically stable.  He was extubated early the morning of  November 28, 2007.  His chest tubes were removed on postoperative day #1.  Follow-up x-ray revealed no pneumothorax.  His volume overloaded and  diuresed accordingly.  He was also weaned off his drips as tolerated.  He was transferred to 2000 for further convalescence.  Currently on  postop  day #2, he is afebrile.  Blood pressure is stable.  O2 sat 99% on  3 L nasal cannula.  Preop weight is 112 kg and today's weight is 116.1  kg.  CBG is 203 and 264 respectively.   On physical exam, cardiovascular slightly tachycardic, pulmonary  decreased at the bases bilaterally.  Extremities, +1 bilateral edema.  His dressings are dry and intact.  These will be removed later today  regarding his hyperglycemia, the patient is already on his home dose of  Avandia, NPH, 70/30 insulin as well as glipizide and metformin will be  added.  The patient was on this preoperatively.  He will be encouraging  continue his incentive spirometer and his O2 will be weaned as  tolerated.  He will most likely be discharged on December 01, 2007.  Marland Kitchen  Last  chest x-ray done November 29, 2007, showed a low lung volumes, left lower  lobe atelectasis and small effusion, but no pneumothorax.   PREVIOUS LABORATORY  STUDIES:  Showed potassium 4.5, BUN and creatinine  26 and 1.1 respectively.  BUN will be checked on November 30, 2007.  Last  CBC was done on November 29, 2007, H and H was 11.2 and 33.6 respectively,  white count was 7500, platelet count 148,000.   DISCHARGE INSTRUCTIONS:  Include the following, the patient is to remain  on a carbohydrate modified medium caloric diabetic diet.  He is not to  drive or lift more than 10 pounds until instructed.  He is to continue  with breathing exercises daily.  He is to walk every day and increase  his routine duration as tolerated.   FOLLOWUP APPOINTMENTS:  Include Dr. Swaziland in 2 weeks.  The patient is  to call his office for an appointment.  In addition, the patient has an  appointment to see [name] on December 17, 2007.  Prior to this  appointment, a chest x-ray will be obtained.   DISCHARGE MEDICATIONS:  1. EC ASA 325 mg p.o. daily.  2. Lopressor 25 mg p.o. twice daily.  3. Lisinopril 20 mg p.o. daily.  4. Zocor 80 mg p.o. at bedtime.  5. Avandia 8 mg p.o. daily.  6. Glipizide 10  p.o. daily.  7. Zoloft 100 mg p.o. daily.  8. Insulin 70/30 50 units every morning, NPH insulin 60 units every      evening.  9. Acarbose 25 mg p.o. 3 times daily.  10.Oxycodone 5 mg 1-2 tablets q.4-6 hours as needed for pain.  In      addition, it will be determined on the day of discharge whether or      not the patient require further Lasix with potassium supplement.      Doree Fudge, [place]      Evelene Croon, M.D.  Electronically Signed    DZ/MEDQ  D:  11/29/2007  T:  11/30/2007  Job:  [id number]   cc:   [name], M.D.

## 2010-11-01 NOTE — Assessment & Plan Note (Signed)
OFFICE VISIT   Jordan, GORACKE  DOB:  Jul 01, 1955                                        December 24, 2007  CHART #:  [id number]   HISTORY:  The patient returned to my office today for followup status  post coronary artery bypass graft surgery x5 on 11/27/2007.  He has done  well postoperatively and is already back driving.  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  He has been checking his blood  sugars a couple of times a day and they have been staying at 140 or  less.   PHYSICAL EXAMINATION:  VITAL SIGNS:  His blood pressure is 106/67, his  pulse is 98 and regular.  Respiratory rate is 18 and unlabored.  Oxygen  saturation on room air is 96%.  GENERAL:  He looks well.  CARDIAC:  Regular rate and rhythm with normal heart sounds.  LUNG:  Clear.  CHEST:  The chest incision is healing well.  The sternum is stable.  EXTREMITIES:  His right leg and left leg incisions are healing well.  There is no significant peripheral edema.   IMAGING STUDIES:  Followup chest x-ray from 12/17/2007 shows a tiny  residual left pleural effusion.  Lungs were otherwise clear.   MEDICATIONS:  His medication list was reviewed and no changes were made.   IMPRESSION:  Overall, the patient is recovering well following his  surgery.  He seems motivated to modify his cardiac risk factors.  He is  planning on participating in cardiac rehab.  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  He will continue to  follow up with Dr. Swaziland and will contact me if he develops any  problems with his incisions.   Evelene Croon, M.D.  Electronically Signed   BB/MEDQ  D:  12/24/2007  T:  12/25/2007  Job:  [id number]   cc:   Jordan Burns, M.D.  [name], M.D.

## 2010-11-01 NOTE — H&P (Signed)
NAME:  RAHMEL, NEDVED NO.:  [id number]   MEDICAL RECORD NO.:  [id number]          PATIENT TYPE:   LOCATION:                                 FACILITY:   PHYSICIAN:  [name], M.D.  DATE OF BIRTH:  [birth year]   DATE OF ADMISSION:  11/22/2007  DATE OF DISCHARGE:                              HISTORY & PHYSICAL   HISTORY OF PRESENT ILLNESS:  Mr. Godshall is a 55 year old white male who  was seen for evaluation of chest pain.  He describes a 44-month history  of substernal chest pain with exertion and is localized to his mid chest  that does not radiate.  He has no associated shortness of breath,  nausea, vomiting, or sweating.  His pain is relieved with rest.  His  pain may occur with just simple walking and does feel exacerbated when  he does any lifting.  He was given a prescription for nitroglycerin, but  has not used it yet, since his pain always goes away with rest.  The  patient has multiple cardiac risk factors including longstanding insulin-  dependent diabetes mellitus, hypercholesterolemia, hypertension, and  strong family history of coronary disease.  Given his classic anginal  symptoms, it was recommended he go directly to coronary angiography for  evaluation and he is now admitted for that purpose.   PAST MEDICAL HISTORY:  1. Insulin-dependent diabetes mellitus since age 31.  2. Hypercholesterolemia.  3. Hypertension.  4. History of peripheral neuropathy.  5. History of carpal tunnel syndrome.   ALLERGIES:  To TOPICAL BETADINE.   CURRENT MEDICATIONS:  Include insulin NPH 50 units nightly, insulin  70/30 mg, 50 units q. a.m. morning, metformin 500 mg, 2 tablets t.i.d.,  acarbose 50 mg 1/2 tablet t.i.d., sertraline 100 mg per day, Actos 8 mg  per day, glipizide 10 mg per day, simvastatin 80 mg daily, aspirin 81 mg  per day, lisinopril 20 mg per day.   SOCIAL HISTORY:  The patient is self-employed with a long care service.  He is married.  He  has no children.  He denies tobacco or alcohol use.   FAMILY HISTORY:  Father died age 17.  He was stroked.  He had three  prior heart attacks.  Mother is aged 61 and has advanced Alzheimer's  disease.  She has also had a history of atrial fibrillation and coronary  bypass surgery.  Two brothers are alive.  One brother had a history of  heart disease and diabetes.  One brother is in good health.  One brother  died in his 16s, was murdered.  One sister passed away at age 42 of  unknown causes.   REVIEW OF SYSTEMS:  The patient states he has been evaluated with Holter  monitor by the [facility] and this was apparently normal.  He has  had no prior stress testing.  He denies any edema, orthopnea, or PND.  He had no significant diabetic retinopathy or nephropathy.  He has no  history of transient ischemic attack or stroke.  Other review of systems  are negative.   PHYSICAL EXAMINATION:  GENERAL:  Obese white male in no distress.  VITAL SIGNS:  Weight is 252 pounds, blood pressure is 132/80, and pulse  is 88 and regular.  HEENT:  He is normocephalic and atraumatic.  Pupils equal, round,  reactive to light accommodation.  Extraocular movements are full.  Oropharynx is clear.  NECK:  Supple without JVD, adenopathy, thyromegaly, or bruits.  LUNGS:  Clear.  CARDIAC:  Reveals a regular rate and rhythm.  Normal S1 and S2 without  gallop, murmur, rub, or click.  ABDOMEN:  Soft and nontender.  He has no hepatosplenomegaly masses or  bruits.  EXTREMITIES:  Femoral pedal pulses were palpable.  He has no edema or  cyanosis.  There are no phlebitis.  NEUROLOGIC:  Nonfocal.   LABORATORY DATA:  Prior chest x-ray showed mild-to-moderate cardiomegaly  without active disease.  ECG showed normal sinus rhythm, first-degree AV  block, left axis deviation.  There is T-wave abnormality consistent with  anterior ischemia.   IMPRESSION:  1. Classic anginal symptoms in a patient with high risk for  cardiac      disease.  2. Insulin-dependent diabetes mellitus.  3. Hypercholesterolemia.  4. Hypertension.  5. Peripheral neuropathy.  6. Cardiomegaly.   PLAN:  The patient will have an echocardiogram performed, will proceed  with diagnostic cardiac catheterization with further therapy pending  these results.           ______________________________  [name], M.D.     PMJ/MEDQ  D:  11/14/2007  T:  11/15/2007  Job:  [id number]   cc:   [name], M.D.  [facility] [place]

## 2010-11-01 NOTE — H&P (Signed)
NAMEMarland Kitchen  Jordan, Burns NO.:  [id number]   MEDICAL RECORD NO.:  [id number]          PATIENT TYPE:  INP   LOCATION:  3731                         FACILITY:  [facility]   PHYSICIAN:  Jordan Burns, M.D.  DATE OF BIRTH:  12-25-1955   DATE OF ADMISSION:  11/25/2007  DATE OF DISCHARGE:                              HISTORY & PHYSICAL   [name] is a 55 year old white male who was seen recently for  evaluation of typical anginal symptoms which he had been experiencing  for the past 6 months.  He describes substernal chest pain localized  more to the right side of his chest that does not otherwise radiate.  He  had no associated shortness of breath, nausea, vomiting, or sweating.  His pain occurred with exertion and was relieved with rest.  He  previously had no rest angina.  He has multiple cardiac risk factors  including history of insulin-dependent diabetes mellitus, hypertension,  hypercholesterolemia, and strong family history of coronary disease.  Based on these symptoms, he underwent cardiac catheterization on November 22, 2007.  This demonstrated severe 3-vessel coronary artery disease.  He  had a dual LAD system with 70% stenosis in the septal portion of this  vessel and a 50% stenosis in the diagonal portion.  The left circumflex  coronary artery had a 95% stenosis in the mid vessel.  The right  coronary artery had a 99% stenosis proximally followed by total  occlusion of the mid vessel with left-to-right collaterals.  His left  ventricular function was normal.  He was started on beta-blocker therapy  and was scheduled see Dr. Laneta Burns tomorrow for evaluation for bypass  surgery.  Initially, he felt much better on the Toprol, but then today  he developed recurrent chest pain at rest.  He describes an low grade  ache localized to the right chest.  It has been persisted throughout the  day.  He has no associated shortness of breath, but has had a couple of  episodes of  diaphoresis.  Given his ongoing chest pain and known severe  3-vessel disease, he was admitted for medical stabilization and proceed  with evaluation for bypass surgery.   PAST MEDICAL HISTORY:  1. Insulin-dependent diabetes mellitus.  2. Hypercholesterolemia.  3. Hypertension.  4. History of peripheral neuropathy.  5. Carpal tunnel syndrome.   ALLERGIES:  TOPICAL BETADINE.   MEDICATIONS:  1. NPH insulin 50 units nightly.  2. Insulin 70/30, 50 units q.a.m.  3. Metformin 1000 mg t.i.d.  4. Acarbose 25 mg t.i.d.  5. Sertraline 100 mg per day.  6. Actos 8 mg per day.  7. Glipizide 10 mg per day.  8. Simvastatin 80 mg per day.  9. Aspirin 81 mg per day.  10.Lisinopril 20 mg per day.   SOCIAL HISTORY:  The patient is self-employed with a lawn care service.  He is married, has no children.  He denies tobacco or alcohol use.   FAMILY HISTORY:  Father died at age 68 with a stroke.  Prior to that, he  had 3 heart attacks.  Mother is age  75 and has an advanced Alzheimer's  disease.  She also has a history of atrial fibrillation and has had  previous coronary bypass surgery.  One brother has a history of heart  disease and diabetes.  One brother is in good health.  One brother died  of a murder in his 71s.  One sister passed away at age 51 of unknown  causes.   His review of systems is otherwise unremarkable.   PHYSICAL EXAM:  The patient is obese white male in no apparent distress,  pulse is 91, blood pressure 132/78, respirations were normal.  His HEENT  exam is unremarkable.  He has no JVD, adenopathy, thyromegaly, or  bruits.  Lungs were clear.  Cardiac exam reveals regular rate and rhythm  without gallop, murmur, rub, or click.  Abdomen is soft and nontender.  He has no masses or bruits.  Femoral pedal pulses are 2+ and symmetric.  He does have some bruising in his right groin area.  He has some  superficial excoriations in the lower extremities.  ECG showed no acute   changes.   IMPRESSION:  1. Unstable angina pectoris in a patient with known 3-vessel coronary      artery disease.  2. Insulin-dependent diabetes mellitus.  3. Hypercholesterolemia.  4. Hypertension.  5. Peripheral neuropathy.   PLAN:  The patient will be admitted.  We will rule out myocardial  infarction.  He will be treated with IV nitroglycerin and IV heparin.  We will hold his metformin.  We will obtain carotid and lower extremity  arterial Dopplers in the morning and proceed with evaluation for  coronary artery bypass surgery.           ______________________________  Jordan Burns, M.D.     PMJ/MEDQ  D:  11/25/2007  T:  11/26/2007  Job:  [id number]   cc:   Evelene Croon, M.D.  [name], M.D.

## 2010-11-01 NOTE — Cardiovascular Report (Signed)
NAME:  Jordan Burns, GUYTON NO.:  [id number]   MEDICAL RECORD NO.:  [id number]          PATIENT TYPE:  OIB   LOCATION:  1961                         FACILITY:  [facility]   PHYSICIAN:  [name], M.D.  DATE OF BIRTH:  Jun 25, 1955   DATE OF PROCEDURE:  11/22/2007  DATE OF DISCHARGE:                            CARDIAC CATHETERIZATION   INDICATIONS FOR PROCEDURE:  A 55 year old white male with history of  insulin-dependent diabetes mellitus, hypertension, and  hypercholesterolemia presents with typical symptoms of angina.   PROCEDURE:  Left heart catheterization, coronary and left ventricular  angiography access via the right femoral artery using standard Seldinger  technique equipment, 4-French 4-cm left Judkins catheter, 4-French 3-D  RC catheter, 4-French pigtail catheter, and 4-French arterial sheath.   MEDICATIONS:  Local anesthesia with 1% Xylocaine, contrast 95 mL of  Omnipaque.   HEMODYNAMIC DATA:  Aortic pressure is 119/72 with a mean of 92 mmHg,  left ventricle pressure is 113 with EDP of 15 mmHg.   ANGIOGRAPHIC DATA:  The left coronary artery arises and distributes  normally.  The left main coronary artery is short and is normal.   The left anterior descending artery is a dual LAD system.  It is  moderately calcified throughout the proximal vessel.  The septal portion  of the LAD has a 70% stenosis in the midvessel.  The diagonal portion of  the LAD has a 50% ostial stenosis.   Left circumflex coronary artery gives rise to a very small first  marginal vessel and then a small second obtuse marginal vessel before  terminating and a large obtuse marginal branch.  There is a 95% stenosis  in the mid circumflex.  There is TIMI grade 2 flow down the second  obtuse marginal vessel.   The right coronary artery is a dominant vessel.  It is heavily calcified  throughout its course.  There is a 99% stenosis proximally followed by  total occlusion of the midvessel.   The distal right coronary fills by  left-to-right collaterals.   Left ventricular angiography was performed in the RAO view.  This  demonstrates normal left ventricular size and contractility with normal  systolic function.  Ejection fraction is estimated at 55%.  There is no  significant mitral insufficiency.   FINAL INTERPRETATION:  1. Severe three-vessel obstructive atherosclerotic coronary artery      disease.  2. Normal left ventricular function.   PLAN:  We would recommend coronary bypass surgery for revascularization.           ______________________________  [name], M.D.     PMJ/MEDQ  D:  11/22/2007  T:  11/22/2007  Job:  [id number]   cc:   [name], M.D.  Sheliah Plane, MD  [facility]

## 2010-11-01 NOTE — Consult Note (Signed)
Jordan Burns, Jordan Burns               ACCOUNT NO.:  [id number]   MEDICAL RECORD NO.:  [id number]          PATIENT TYPE:  INP   LOCATION:  3731                         FACILITY:  [facility]   PHYSICIAN:  Evelene Croon, M.D.     DATE OF BIRTH:  [birth year]   DATE OF CONSULTATION:  11/26/2007  DATE OF DISCHARGE:                                 CONSULTATION   REFERRING PHYSICIAN:  [name], MD   REASON FOR CONSULTATION:  Severe 3-vessel coronary artery disease with  unstable angina.   CLINICAL HISTORY:  Assessed by Dr. Swaziland who evaluated Mr .Lofaso for  consideration of coronary artery bypass graft surgery.  He is a 55-year-  old gentleman with long history of insulin-dependent diabetes, history  of hypertension, and history of hypercholesteremia who presents with a 6-  month history of right chest and substernal pain without radiation  associated with exertion and relief with rest.  Recently, these symptoms  have worsen and have been occurring with rest.  He underwent cardiac  catheterization on November 22, 2007, which showed severe 3-vessel coronary  disease.  There was a dual LAD system with marked 70% stenosis in the  septal portion and 50% stenosis in the diagonal portion.  The left  circumflex had 95% mid-vessel stenosis.  The right coronary artery had  99% proximal stenosis followed by total occlusion with left-right  collateral filling distal vessel.  Left ventricular function was normal.  He was started on beta-blocker and scheduled to see me in the office  this afternoon.  He said his symptoms improved initially, but then  yesterday he developed the same chest pain at rest, which lasted most of  the day.  His initial CPK was 297 and MB of 7.0 and decreased 196 with  an MB of 3.8.  His initial troponin was less than 0.01 and then  increased to 0.01.  Electrocardiogram showed nonspecific T-wave  abnormality.  He was started on intravenous heparin and nitroglycerin,  and has had no  further chest pain.   REVIEW OF SYSTEMS:  GENERAL:  He denies any fever or chills.  He has had  no recent weight changes.  He has had some fatigue.  EYES:  Negative.  ENT:  Negative.  ENDOCRINE:  He denies hypothyroidism.  He does have  history of diabetes since being in his 92s.  He said that his diabetic  control has been variable.  CARDIOVASCULAR:  As above.  He has had  substernal chest pain more towards the right side of his chest.  He  denies exertional dyspnea.  He has had no PND or orthopnea.  He has had  no peripheral edema.  He has had some palpitations.  RESPIRATORY:  He  denies cough and sputum production.  GI:  He has had no nausea or  vomiting.  He denies melena and bright red blood per rectum.  GU:  He  denies dysuria and hematuria.  NEUROLOGICAL:  He denies any focal  weakness.  He has had peripheral neuropathy particularly in his legs.  He has some status post bilateral carpal tunnel  syndrome and he still  has occasional symptoms in his hands.  He denies any history of TIA or  stroke.  Musculoskeletal:  He denies arthralgias and myalgias.  PSYCHIATRIC:  Negative.  HEMATOLOGICAL:  Negative.   ALLERGIES:  To topical Betadine.   PAST MEDICAL HISTORY:  Significant for insulin-dependent diabetes as  mentioned above.  He has history of hypertension and hypercholesteremia.  He has history of carpal tunnel syndrome, status post bilateral upper  extremity carpal tunnel release.  He is status post surgery on his right  foot for Charcot joint.   MEDICATIONS PRIOR TO ADMISSION:  1. NPH insulin 50 units nightly, 70-30 insulin 50 units q.a.m.  2. Glucophage 1000 mg t.i.d.  3. Acarbose 25 mg t.i.d.  4. Sertraline 100 mg daily.  5. Actos 8 mg daily.  6. Glipizide 10 mg daily.  7. Zocor 80 mg daily.  8. Aspirin 81 mg daily.  9. Lisinopril 20 mg daily.   SOCIAL HISTORY:  He is self-employed in a  [employer].  He is  married.  He has a son who is here today.  He has never  smoked, denies  alcohol abuse.   FAMILY HISTORY:  His father died at 85 with stroke, and had 3 heart  attack prior to that.  His mother is aged 68 and has advanced Alzheimer  disease.  She has also had bypass surgery in the past.  One brother has  history of heart disease and diabetes.  He has another brother in good  health, another brother died of murder in his 32s.  He has sister who  died at age 9 of unknown cause.   PHYSICAL EXAMINATION:  VITAL SIGNS:  Blood pressure is 93/46, and pulse  is 72 and regular.  Respiratory rate is 16 and unlabored.  GENERAL:  He is a moderately obese white male in no distress.  HEENT:  Shows to be normocephalic and atraumatic.  Pupils are equal and  reactive to light and accommodation.  Extraocular muscles are intact.  His throat is clear.  NECK:  Shows normal carotid pulses bilaterally.  There are no bruits.  There is no adenopathy or thyromegaly.  CARDIAC:  Shows regular rate and rhythm with normal S1 and S2.  There is  no murmur, rub, or gallop.  LUNGS:  Clear.  ABDOMEN:  Shows active bowel sounds.  His abdomen is soft, obese, and  nontender.  There are no palpable mass.  No organomegaly.  EXTREMITIES:  Shows no peripheral edema.  Pedal pulses are palpable  bilaterally.  SKIN:  Warm and dry.  NEUROLOGIC:  Shows him to be alert and oriented x3.  Motor and sensory  exams are grossly normal.   Carotid Doppler examination shows no events of internal carotid artery  stenosis.  Lower extremity arterial waveforms are triphasic.  Upper  extremity Doppler shows right side remains normal with radial and ulnar  compression and the left remains normal with radial compression, but  diminishes greater than 50% for ulnar compression.   IMPRESSION:  Mr. Hufstetler has severe 3-vessel coronary disease with  unstable anginal symptoms.  I agree the coronary artery bypass graft  surgery is best treatment rendered for the ischemia and infarction.  I  discussed the  operative procedure with he and his wife, his son  including alternatives, benefits, and risks including, but not limited  to bleeding, blood transfusion, infection, stroke, myocardia infarction,  graft failure, and death.  He understands and agrees to proceed.  We  will attempt on planning to do this on Friday of this week as long as he  remains stable on heparin and nitroglycerin.      Evelene Croon, M.D.  Electronically Signed     BB/MEDQ  D:  11/26/2007  T:  11/27/2007  Job:  [id number]   cc:   [name], M.D.  [name], M.D.

## 2010-11-04 NOTE — Procedures (Signed)
NAME:  Jordan Burns, Jordan Burns               ACCOUNT NO.:  [id number]   MEDICAL RECORD NO.:  [id number]          PATIENT TYPE:  OUT   LOCATION:  SLEEP CENTER                 FACILITY:  [facility]   PHYSICIAN:  Oretha Milch, MD      DATE OF BIRTH:  July 21, 1955   DATE OF STUDY:  06/26/2008                            NOCTURNAL POLYSOMNOGRAM   REFERRING PHYSICIAN:   Nocturnal polysomnogram was performed with a sleep technologist in  attendance.  EEG, EOG, EMG, EKG, and respiratory parameters were  recorded.  Sleep stages arousal, limb movements, and respiratory data  were scored according to criteria laid out by the American Academy of  Sleep Medicine.   INDICATION FOR STUDY:  Mr. Fallen is a 55 year old gentleman with non-  refreshing sleep excessive daytime fatigue, loud snoring, and witnessed  apneas.  At the time of the study, he weighed about 240 pounds with a  height of 6 feet, BMI of 33 and neck size 18.5 inches.   EPWORTH SLEEPINESS SCORE:  Epworth sleepiness score of 15/24.  He has  had a recent CABG.   MEDICATIONS:   SLEEP ARCHITECTURE:  Lights-out was at 9:50 p.m.  Lights-on was at 5:05  a.m.  CPAP was applied at 31 minutes past midnight.  During the  diagnostic portion, total sleep time was 124 minutes with a sleep period  time of 145 minutes and a sleep maintenance efficiency of 90%.  Sleep  latency was 15 minutes.  Sleep stages as a percentage of total sleep  time was N1 10.6%, N2 89.6%, and N3 0%, and REM sleep 3.5 minutes.  During the titration portion, REM sleep accounted for 59 minutes (24.3%)  and supine REM sleep was noted for 59 minutes.   RESPIRATORY DATA:  During the diagnostic portion, there were a total of  0 obstructive apnea, 0 central apnea, 0 mixed apnea, 18 hypopneas, and  11 RERAs with an AHI of 8.7 events per hour and an RDI of 14 events per  hour.  Due to this degree of respiratory disturbance, CPAP was applied  at 5 cm and titrated to a maximum of 13 cm.  At a  level of 9 cm for 52  minutes of sleep including 45 minutes of supine REM sleep, 1 obstructive  apnea and 1 hypopnea was noted with a lowest desaturation of 88%.  At a  level of 12 cm for 56.5 minutes of sleep including 36.5 minutes of  sleep, 1 central apnea and 1 hypopnea was noted with a lowest  desaturation of 92% and 12 arousals were noted.  Due to snoring, it was  titrated further to 13 cm at this level for 16 minutes, 1 central apnea  was noted and 4 arousals were noted.  The optimal CPAP level appears to  be 12 cm.   OXYGEN SATURATION DATA:  The lowest desaturation was 87% during the  diagnostic portion and desaturated to 92% on CPAP of 12 cm and stayed at  95% on 13 cm.   LIMB MOVEMENT DATA:  A few limb movements were noted during the  diagnostic portion which seemed to disappear during the titration  portion.  CARDIAC DATA:  Some inverted T waves and first-degree heart block were  noted.  During the study, the lowest heart rate was 55 beats per minute,  the high heart rate recorded was an artifact.   MOVEMENT-PARASOMNIA:  None noted   IMPRESSIONS-RECOMMENDATIONS:  1. Mild obstructive sleep apnea with predominant hypopneas and      respiratory effort-related arousals causing desaturation and sleep      fragmentation.  2. This was corrected by application of continuous positive airway      pressure of 12 cm.  3. No evidence of cardiac arrhythmias, limb movements, or behavioral      disturbance during sleep.   RECOMMENDATIONS:  1. CPAP of 12 cm with medium-size full-face mask and heated humidity.  2. He should be cautioned against driving when sleepy.  He should be      asked to avoid medications with sedative side effects.  Compliance      should be monitored at this level.      Oretha Milch, MD  Electronically Signed     RVA/MEDQ  D:  06/26/2008 10:48:58  T:  06/27/2008 01:02:42  Job:  [id number]

## 2010-11-04 NOTE — Op Note (Signed)
Jordan Burns, Jordan Burns               ACCOUNT NO.:  [id number]   MEDICAL RECORD NO.:  [id number]           PATIENT TYPE:   LOCATION:                                 FACILITY:   PHYSICIAN:  Evelene Croon, M.D.     DATE OF BIRTH:  03/08/1956   DATE OF PROCEDURE:  08/27/2007  DATE OF DISCHARGE:                               OPERATIVE REPORT   PREOPERATIVE DIAGNOSIS:  Severe 3-vessel coronary artery disease with  unstable angina.   POSTOPERATIVE DIAGNOSIS:  Severe 3-vessel coronary artery disease with  unstable angina.   OPERATIVE PROCEDURE:  Median sternotomy, extracorporeal circulation,  coronary artery bypass graft surgery x5, using a left internal mammary  artery graft to the second diagonal branch of the LAD, a saphenous vein  graft to the first diagonal branch of the LAD, a saphenous vein graft to  the second obtuse marginal branch of the left circumflex coronary  artery, and a sequential saphenous vein graft to the posterolateral  branch of the right coronary artery, and the distal left circumflex  coronary artery.  Endoscopic vein harvest from both legs.   ATTENDING SURGEON:  Evelene Croon, MD   ASSISTANT:  Rowe Clack, PA-C   ANESTHESIA:  General endotracheal.   CLINICAL HISTORY:  This patient is a 55 year old gentleman with long  history of insulin-dependent diabetes, history of hypertension, a  history of hypercholesterolemia, who presented with a 59-month history of  right-sided chest and substernal pain.  These symptoms have worsened  recently and have been occurring with rest.  He underwent cardiac  catheterization on November 22, 2007, by Dr. [place] which showed severe 3-  vessel disease.  The catheterization suggested that he had a dual LAD  system with a 70% stenosis in the septal portion and 50% stenosis in the  diagonal portion.  The left circumflex had 95% mid vessel stenosis.  There was a small first marginal and then a larger second marginal that  was diffusely  diseased and essentially occluded filling faintly by  collaterals.  The distal left circumflex gave off a moderate-to-large  size branch.  The right coronary artery had 99% proximal stenosis  followed by total occlusion with left-to-right collaterals filling the  distal vessel.  The posterior descending branch appeared diffusely  diseased.  There was also a large posterolateral branch.  The left  ventricular function was normal.  He was started on beta-blocker after  his catheterization and was scheduled see me in the office as an  outpatient, but was admitted with unstable anginal symptoms at rest.  His CPK was 297 with an MB of 7.0.  His initial troponin was 0.01 and  increased to 0.01.  Electrocardiogram showed nonspecific T-wave  abnormality.  He was started on intravenous heparin and nitroglycerin  and remained stable yesterday, but this morning was awoke with chest  pain.  I saw the patient early in the morning and felt that we should  proceed with coronary bypass surgery today.  I discussed the operative  procedure with the patient and his wife.  We discussed alternatives,  benefits, and risks including  but not limited to bleeding, blood  transfusion, infection, stroke, myocardial infarction, graft failure,  and death.  He understood and agreed to proceed.   OPERATIVE PROCEDURE:  The patient was taken to the operating room and  placed on the table in supine position.  After induction of general  endotracheal anesthesia, a Foley catheter was placed in the bladder  using sterile technique.  Then, the chest, abdomen, and both lower  extremities were prepped and draped in usual sterile manner.  The chest  was entered through a median sternotomy incision.  The pericardium  opened in the midline.  Examination of the heart showed good ventricular  contractility with a large amount of epicardial fat.  The ascending  aorta had no palpable plaques in it.   Then, the left internal mammary  artery was harvested from the chest wall  as a pedicle graft.  This was a medium-caliber vessel with excellent  blood flow through it.  At the same time, a segment of the greater  saphenous vein was harvested from the right leg using endoscopic vein  harvest technique.  The vein in the thigh was medium-sized good quality.  Below the knee, the vein has several varicosities and was poor quality.  Therefore, another section of vein was harvested from the left leg using  endoscopic vein harvest technique and this vein was also medium size and  good quality.  I did not use right a internal mammary artery graft in  this patient given his obesity and diabetes and the fact that there was  no major vessel I thought would be suitable for using the right mammary  graft in the site to bypass.  Also, did not use radial arteries for  graft since he had bilateral carpal tunnel releases and had some ongoing  symptoms of carpal tunnel syndrome.   Then, the patient was heparinized, and when an adequate activated  clotting time was achieved, the distal ascending aorta was cannulated  using a 22-French aortic cannula for arterial inflow.  Venous outflow  was achieved using a 2-stage venous cannula for the right atrial  appendage.  An antegrade cardioplegia and vent cannula was inserted in  the aortic root.   Then, the patient was placed on cardiopulmonary bypass and distal  coronaries identified.  He had severe diffuse calcific coronary disease.  The LAD was actually diffusely diseased and was occluded just beyond  what was the second diagonal branch.  This distal portion of the LAD was  a small calcified tube with no visible lumen.  The second diagonal  branch was what was felt to be the diagonal portion of a dual LAD  system.  The first diagonal branch was a moderately large vessel and was  what was felt to be the septal portion of the dual LAD system.  In  naturally, these were both diagonal branches  with occlusion of the  distal LAD beyond the second diagonal branch.  This distal LAD was not  graftable.  The left circumflex gave off a small first marginal that was  not graftable.  The second marginal was diffusely diseased and was  basically a calcified pipe out into the distal vessel.  It bifurcated  distally into small sub-branches.  One of these was borderline,  graftable, and small.  The distal left circumflex terminated in a  moderate-sized branch that was graftable.  The right coronary artery was  also a diffuse calcified tube.  The posterior descending artery was  calcified  but wholly out to the distal vessel and this calcification was  circumferential with no visible lumen.  This was not graftable.  The  posterolateral branch of the right coronary artery was a large graftable  vessel with some segmental plaque present.   Then, the aorta was cross clamped and 600 mL of cold blood antegrade  cardioplegia was administered in the aortic root with quick arrest of  the heart.  Systemic hypothermia 30 degrees centigrade and topical  hypothermic iced saline was used.  A temperature probe was placed and a  septum insulating pad in the pericardium.   Then, the first distal anastomosis was performed to the posterolateral  branch of the right coronary artery.  The internal diameter was about  1.7 mm.  The conduit used was a segment of greater saphenous vein, and  the anastomosis performed in a sequential side-to-side manner using  continuous 7-0 Prolene suture.  Flow noted through the graft was  excellent.   Then, the second distal anastomosis was performed to the distal left  circumflex coronary artery.  The internal diameter was also about 1.71  mm.  The conduit used was the same segment of greater saphenous vein.  The anastomosis was performed in a sequential end-to-side manner using  continuous 7-0 Prolene suture.  Flow was noted through the graft and was  excellent.  Then,  another dose of cardioplegia was given down vein graft  and the aortic root.   The third distal anastomosis was performed to the second marginal  branch.  This was grafted quite distally.  The internal diameter here  was only about 1.5 mm.  The conduit used was a second segment of greater  saphenous vein.  The anastomosis was performed in an end-to-side manner  using continuous 7-0 Prolene suture.  Flow through this graft was fair.  I could not pass a 1-mm probe back proximally in this vessel.   Then, the fourth distal anastomosis was performed to the first diagonal  branch.  The internal diameter was about 1.6 mm.  The conduit used as  well as a third segment of greater saphenous vein.  The anastomosis was  performed in an end-to-side manner using continuous 7-0 Prolene suture.  Flow was noted through the graft and was good.  Then, another dose of  cardioplegia was given down the vein grafts and the aortic root.   The fifth distal anastomosis was then performed to the second diagonal  branch.  The internal diameter of this vessel was also about 1.6 mm.  The conduit used was the left internal mammary graft.  This was brought  through an opening left pericardium anterior to the phrenic nerve.  This  was anastomosed in an end-to-side manner using continuous 8-0 Prolene  suture.  The pedicle was sutured to the epicardium with 6-0 Prolene  sutures.  The patient was rewarmed to 37 degrees centigrade.  With cross-  clamp in place, the 3 proximal vein graft anastomosis were performed,  the aortic root in an end-to-side manner using continuous 6-0 Prolene  suture.  Then, the clamp was removed from the mammary pedicle.  There  was rapid warming of the ventricular septum and return of spontaneous  ventricular fibrillation.  The cross-clamp was removed with a time of  106 minutes, and the patient spontaneously converted to sinus rhythm.  The proximal and distal anastomoses appeared hemostatic and  laid the  grafts satisfactory.  A graft marker was placed around the proximal  anastomoses.  Two temporary right ventricular and right atrial pacing  wires were placed through the skin.   The patient was rewarmed to 37 degrees centigrade.  He was weaned from  cardiopulmonary bypass on low-dose dopamine.  Total bypass time was 126  minutes.  Cardiac function appeared good with a cardiac output of 9 L  per minute.  Protamine was given, and venous and aortic cannula was  removed without difficulty.  Hemostasis was achieved.  Three chest tubes  were placed in the tube in the posterior pericardium, one in left  pleural space and one in the anterior mediastinum.  The pericardium was  reapproximated of the heart.  The sternum was closed with a #6 stainless  steel wires.  The fascia was closed with continuous #1 Vicryl suture.  Subcu tissue was closed with continuous 2-0 Vicryl and the skin with 3-0  Vicryl subcuticular closure.  The lower extremity vein harvest site was  closed in layers in a similar manner.  The sponge, needles, and counts  were correct according to the scrub nurse.  Dry sterile dressing were  applied over the incisions around the chest tubes which were placed in  Pleur-Evac suction.  The patient remained hemodynamically stable and was  transferred to the SICU in guarded but stable condition.      Evelene Croon, M.D.  Electronically Signed     BB/MEDQ  D:  11/27/2007  T:  11/28/2007  Job:  [id number]   cc:   [name], M.D.

## 2011-03-16 LAB — POCT I-STAT 3, ART BLOOD GAS (G3+)
Acid-base deficit: 1
Acid-base deficit: 5 — ABNORMAL HIGH
O2 Saturation: 100
O2 Saturation: 95
Operator id: 179741
Operator id: 252031
Operator id: 270211
Patient temperature: 36.3
Patient temperature: 37.7
Patient temperature: 97.7
TCO2: 30
pH, Arterial: 7.33 — ABNORMAL LOW
pH, Arterial: 7.432
pO2, Arterial: 76 — ABNORMAL LOW

## 2011-03-16 LAB — BASIC METABOLIC PANEL
BUN: 11
BUN: 23
BUN: 26 — ABNORMAL HIGH
CO2: 25
Calcium: 7.7 — ABNORMAL LOW
Calcium: 9.1
Chloride: 101
Chloride: 104
Chloride: 108
Creatinine, Ser: 0.82
GFR calc Af Amer: >60
GFR calc Af Amer: >60
GFR calc Af Amer: >60
GFR calc non Af Amer: >60
GFR calc non Af Amer: >60
GFR calc non Af Amer: >60
Glucose, Bld: 152 — ABNORMAL HIGH
Potassium: 3.9
Potassium: 4
Potassium: 4.5
Sodium: 133 — ABNORMAL LOW
Sodium: 142

## 2011-03-16 LAB — POCT I-STAT 4, (NA,K, GLUC, HGB,HCT)
Glucose, Bld: 100 — ABNORMAL HIGH
Glucose, Bld: 108 — ABNORMAL HIGH
Glucose, Bld: 127 — ABNORMAL HIGH
Glucose, Bld: 195 — ABNORMAL HIGH
HCT: 30 — ABNORMAL LOW
HCT: 32 — ABNORMAL LOW
HCT: 32 — ABNORMAL LOW
HCT: 37 — ABNORMAL LOW
Hemoglobin: 10.2 — ABNORMAL LOW
Hemoglobin: 10.9 — ABNORMAL LOW
Hemoglobin: 10.9 — ABNORMAL LOW
Hemoglobin: 12.6 — ABNORMAL LOW
Hemoglobin: 13.3
Operator id: 3291
Operator id: 3291
Operator id: 3291
Operator id: 3291
Operator id: 3291
Potassium: 3.9
Potassium: 4.1
Potassium: 4.8
Potassium: 5
Potassium: 5.6 — ABNORMAL HIGH
Sodium: 135
Sodium: 140

## 2011-03-16 LAB — URINALYSIS, ROUTINE W REFLEX MICROSCOPIC
Ketones, ur: NEGATIVE
Nitrite: NEGATIVE
Protein, ur: NEGATIVE
Urobilinogen, UA: 0.2

## 2011-03-16 LAB — BLOOD GAS, ARTERIAL
Acid-Base Excess: 0.4
O2 Saturation: 96.1
Patient temperature: 98.6
pO2, Arterial: 77 — ABNORMAL LOW

## 2011-03-16 LAB — LIPID PANEL
LDL Cholesterol: 77
VLDL: 27

## 2011-03-16 LAB — CROSSMATCH: ABO/RH(D): O POS

## 2011-03-16 LAB — CK TOTAL AND CKMB (NOT AT ARMC)
CK, MB: 7 — ABNORMAL HIGH
Relative Index: 2.4

## 2011-03-16 LAB — PREPARE PLATELET PHERESIS

## 2011-03-16 LAB — POCT CARDIAC MARKERS
CKMB, poc: 4.2
Myoglobin, poc: 92.5
Troponin i, poc: <0.05

## 2011-03-16 LAB — CBC
HCT: 32.6 — ABNORMAL LOW
HCT: 38.8 — ABNORMAL LOW
Hemoglobin: 13.3
Hemoglobin: 14.1
MCHC: 34.2
MCV: 88.1
Platelets: 148 — ABNORMAL LOW
Platelets: 170
Platelets: 190
RBC: 4.68
RDW: 13.8
RDW: 14.5
WBC: 5.2
WBC: 7.5
WBC: 7.6

## 2011-03-16 LAB — CARDIAC PANEL(CRET KIN+CKTOT+MB+TROPI)
CK, MB: 3.7
CK, MB: 3.8
Relative Index: 2
Troponin I: <0.01

## 2011-03-16 LAB — POCT I-STAT 3, VENOUS BLOOD GAS (G3P V): Operator id: 3291

## 2011-03-16 LAB — POCT I-STAT, CHEM 8
BUN: 21
Creatinine, Ser: 1.2
Potassium: 4.1
Sodium: 141

## 2011-03-16 LAB — DIFFERENTIAL
Basophils Absolute: 0
Eosinophils Relative: 2
Lymphocytes Relative: 33

## 2011-03-16 LAB — HEMOGLOBIN AND HEMATOCRIT, BLOOD
HCT: 30.1 — ABNORMAL LOW
Hemoglobin: 10.6 — ABNORMAL LOW

## 2011-03-16 LAB — HEMOGLOBIN A1C: Mean Plasma Glucose: 204

## 2011-03-16 LAB — PROTIME-INR: INR: 1.1

## 2011-03-16 LAB — TROPONIN I: Troponin I: 0.01

## 2011-03-16 LAB — PLATELET COUNT: Platelets: 145 — ABNORMAL LOW

## 2011-03-16 LAB — MAGNESIUM: Magnesium: 2.8 — ABNORMAL HIGH

## 2011-03-31 ENCOUNTER — Other Ambulatory Visit (HOSPITAL_COMMUNITY): Payer: Self-pay | Admitting: *Deleted

## 2011-04-10 ENCOUNTER — Other Ambulatory Visit: Payer: Self-pay

## 2011-04-10 DIAGNOSIS — T8620 Unspecified complication of heart transplant: Secondary | ICD-10-CM

## 2011-04-11 ENCOUNTER — Ambulatory Visit (HOSPITAL_COMMUNITY)
Admission: RE | Admit: 2011-04-11 | Discharge: 2011-04-11 | Disposition: A | Discharge status: Home / Self Care | Payer: Non-veteran care | Source: Ambulatory Visit | Attending: Internal Medicine | Admitting: Internal Medicine

## 2011-04-11 DIAGNOSIS — T8620 Unspecified complication of heart transplant: Secondary | ICD-10-CM | POA: Insufficient documentation

## 2011-04-11 DIAGNOSIS — Z941 Heart transplant status: Secondary | ICD-10-CM

## 2011-04-11 LAB — CREATININE, SERUM
Creatinine, Ser: 1.12 mg/dL (ref 0.50–1.35)
GFR calc Af Amer: 82 mL/min — ABNORMAL LOW (ref >90)
GFR calc non Af Amer: 71 mL/min — ABNORMAL LOW (ref >90)

## 2011-04-11 MED ORDER — GADOBENATE DIMEGLUMINE 529 MG/ML IV SOLN
30.0000 mL | Freq: Once | INTRAVENOUS | Status: AC
  Administered 2011-04-11: 30 mL via INTRAVENOUS

## 2011-11-07 ENCOUNTER — Emergency Department (HOSPITAL_BASED_OUTPATIENT_CLINIC_OR_DEPARTMENT_OTHER)
Admission: EM | Admit: 2011-11-07 | Discharge: 2011-11-07 | Disposition: A | Discharge status: Home / Self Care | Payer: Managed Care, Other (non HMO) | Attending: Emergency Medicine | Admitting: Emergency Medicine

## 2011-11-07 ENCOUNTER — Encounter (HOSPITAL_BASED_OUTPATIENT_CLINIC_OR_DEPARTMENT_OTHER): Payer: Self-pay | Admitting: *Deleted

## 2011-11-07 DIAGNOSIS — I10 Essential (primary) hypertension: Secondary | ICD-10-CM | POA: Insufficient documentation

## 2011-11-07 DIAGNOSIS — E7800 Pure hypercholesterolemia, unspecified: Secondary | ICD-10-CM | POA: Insufficient documentation

## 2011-11-07 DIAGNOSIS — I509 Heart failure, unspecified: Secondary | ICD-10-CM | POA: Insufficient documentation

## 2011-11-07 DIAGNOSIS — T148XXA Other injury of unspecified body region, initial encounter: Secondary | ICD-10-CM

## 2011-11-07 DIAGNOSIS — E119 Type 2 diabetes mellitus without complications: Secondary | ICD-10-CM | POA: Insufficient documentation

## 2011-11-07 DIAGNOSIS — Z951 Presence of aortocoronary bypass graft: Secondary | ICD-10-CM | POA: Insufficient documentation

## 2011-11-07 DIAGNOSIS — X58XXXA Exposure to other specified factors, initial encounter: Secondary | ICD-10-CM | POA: Insufficient documentation

## 2011-11-07 DIAGNOSIS — IMO0002 Reserved for concepts with insufficient information to code with codable children: Secondary | ICD-10-CM | POA: Insufficient documentation

## 2011-11-07 HISTORY — DX: Essential (primary) hypertension: I10

## 2011-11-07 HISTORY — DX: Heart failure, unspecified: I50.9

## 2011-11-07 HISTORY — DX: Pure hypercholesterolemia, unspecified: E78.00

## 2011-11-07 NOTE — ED Notes (Signed)
Blister on toe drained and skin trimmed by Dr. Dione Booze.

## 2011-11-07 NOTE — ED Notes (Signed)
CBG 145, data did not cross over from glucometer.

## 2011-11-07 NOTE — Discharge Instructions (Signed)
Blisters Blood sugar today was 145. Get your toe rechecked in 2 days. Blisters are fluid-filled sacs that form within the skin. Common causes of blistering are friction, burns, and exposure to irritating chemicals. The fluid in the blister protects the underlying damaged skin. Most of the time it is not recommended that you open blisters. When a blister is opened, there is an increased chance for infection. Usually, a blister will open on its own. They then dry up and peel off within 10 days. If the blister is tense and uncomfortable (painful) the fluid may be drained. If it is drained the roof of the blister should be left intact. The draining should only be done by a medical professional under aseptic conditions. Poorly fitting shoes and boots can cause blisters by being too tight or too loose. Wearing extra socks or using tape, bandages, or pads over the blister-prone area helps prevent the problem by reducing friction. Blisters heal more slowly if you have diabetes or if you have problems with your circulation. You need to be careful about medical follow-up to prevent infection. HOME CARE INSTRUCTIONS  Protect areas where blisters have formed until the skin is healed. Use a special bandage with a hole cut in the middle around the blister. This reduces pressure and friction. When the blister breaks, trim off the loose skin and keep the area clean by washing it with soap daily. Soaking the blister or broken-open blister with diluted vinegar twice daily for 15 minutes will dry it up and speed the healing. Use 3 tablespoons of white vinegar per quart of water (45 mL white vinegar per liter of water). An antibiotic ointment and a bandage can be used to cover the area after soaking.  SEEK MEDICAL CARE IF:   You develop increased redness, pain, swelling, or drainage in the blistered area.   You develop a pus-like discharge from the blistered area, chills, or a fever.  MAKE SURE YOU:   Understand these  instructions.   Will watch your condition.   Will get help right away if you are not doing well or get worse.  Document Released: 07/13/2004 Document Revised: 05/25/2011 Document Reviewed: 06/10/2008 [facility] Patient Information 2012 [place], [place].

## 2011-11-07 NOTE — ED Provider Notes (Addendum)
History     CSN: [id number]  Arrival date & time 11/07/11  1901   First MD Initiated Contact with Patient 11/07/11 1929      Chief Complaint  Patient presents with  . Blister    (Consider location/radiation/quality/duration/timing/severity/associated sxs/prior treatment) HPI Patient with blister on the second toe right foot noted today nonpainful no other complaint no other associated symptoms Past Medical History  Diagnosis Date  . Diabetes mellitus   . Hypertension   . High cholesterol   . CHF (congestive heart failure)   . Sleep apnea     Past Surgical History  Procedure Date  . Coronary artery bypass graft   . Carpal tunnel release   . Toe surgery   . Hammer toe surgery     No family history on file.  History  Substance Use Topics  . Smoking status: Never Smoker   . Smokeless tobacco: Not on file  . Alcohol Use: No      Review of Systems  Constitutional: Negative.   Skin:       Blister on toe    Allergies  Iodine  Home Medications  No current outpatient prescriptions on file.  BP 135/78  Pulse 84  Temp(Src) 98.4 F (36.9 C) (Oral)  Resp 18  Ht 6' (1.829 m)  Wt 270 lb (122.471 kg)  BMI 36.62 kg/m2  SpO2 96%  Physical Exam  Nursing note and vitals reviewed. Constitutional: He appears well-developed and well-nourished.  HENT:  Head: Normocephalic and atraumatic.  Eyes: EOM are normal.  Neck: Neck supple.  Cardiovascular: Normal rate.   Pulmonary/Chest: Effort normal.  Abdominal: He exhibits no distension.  Musculoskeletal: He exhibits no tenderness.       4 extremities nontender neurovascularly intact  Skin:       1 cm clear fluid filled blister second toe right foot distal phalanx    ED Course  Procedures (including critical care time)  Labs Reviewed - No data to display No results found.   No diagnosis found.  Procedure second toe right foot blister incised and drained time out performed, skin prepped with alcohol a small  nick was made with a sterile scissors clear fluid drained out easily. Loose skin was debridedd with sterile scissors and forceps. Antibiotic ointment applied and a sterile dressing  MDM  No signs of infection Plan recheck wound 2 days Diagnosis #1 blister right second toe #2 hyperglycemia        Doug Sou, MD 11/07/11 1952  Doug Sou, MD 11/07/11 1954

## 2011-11-07 NOTE — ED Notes (Signed)
Pt has blister to 2nd toe of right foot. First noticed it this afternoon. Pt is a diabetic and states sugars have been controlled.

## 2011-11-08 LAB — GLUCOSE, CAPILLARY: Glucose-Capillary: 145 mg/dL — ABNORMAL HIGH (ref 70–99)

## 2011-11-13 ENCOUNTER — Encounter: Payer: Self-pay | Admitting: *Deleted

## 2012-01-04 ENCOUNTER — Encounter: Payer: Self-pay | Admitting: Cardiology

## 2013-12-21 ENCOUNTER — Encounter (HOSPITAL_COMMUNITY): Payer: Self-pay | Admitting: Emergency Medicine

## 2013-12-21 ENCOUNTER — Emergency Department (HOSPITAL_COMMUNITY): Payer: Non-veteran care

## 2013-12-21 ENCOUNTER — Observation Stay (HOSPITAL_COMMUNITY)
Admission: EM | Admit: 2013-12-21 | Discharge: 2013-12-22 | Disposition: A | Discharge status: Home / Self Care | Payer: Non-veteran care | Attending: Cardiology | Admitting: Cardiology

## 2013-12-21 DIAGNOSIS — I2510 Atherosclerotic heart disease of native coronary artery without angina pectoris: Secondary | ICD-10-CM | POA: Insufficient documentation

## 2013-12-21 DIAGNOSIS — G609 Hereditary and idiopathic neuropathy, unspecified: Secondary | ICD-10-CM | POA: Insufficient documentation

## 2013-12-21 DIAGNOSIS — E7800 Pure hypercholesterolemia, unspecified: Secondary | ICD-10-CM | POA: Diagnosis not present

## 2013-12-21 DIAGNOSIS — Z951 Presence of aortocoronary bypass graft: Secondary | ICD-10-CM | POA: Insufficient documentation

## 2013-12-21 DIAGNOSIS — E1121 Type 2 diabetes mellitus with diabetic nephropathy: Secondary | ICD-10-CM | POA: Diagnosis present

## 2013-12-21 DIAGNOSIS — I509 Heart failure, unspecified: Secondary | ICD-10-CM | POA: Diagnosis not present

## 2013-12-21 DIAGNOSIS — R0789 Other chest pain: Secondary | ICD-10-CM

## 2013-12-21 DIAGNOSIS — R072 Precordial pain: Secondary | ICD-10-CM | POA: Diagnosis not present

## 2013-12-21 DIAGNOSIS — E785 Hyperlipidemia, unspecified: Secondary | ICD-10-CM | POA: Diagnosis present

## 2013-12-21 DIAGNOSIS — Z7982 Long term (current) use of aspirin: Secondary | ICD-10-CM | POA: Diagnosis not present

## 2013-12-21 DIAGNOSIS — G4733 Obstructive sleep apnea (adult) (pediatric): Secondary | ICD-10-CM | POA: Diagnosis not present

## 2013-12-21 DIAGNOSIS — E119 Type 2 diabetes mellitus without complications: Secondary | ICD-10-CM | POA: Insufficient documentation

## 2013-12-21 DIAGNOSIS — Z794 Long term (current) use of insulin: Secondary | ICD-10-CM | POA: Diagnosis not present

## 2013-12-21 DIAGNOSIS — R079 Chest pain, unspecified: Secondary | ICD-10-CM

## 2013-12-21 DIAGNOSIS — I10 Essential (primary) hypertension: Secondary | ICD-10-CM | POA: Diagnosis not present

## 2013-12-21 DIAGNOSIS — N182 Chronic kidney disease, stage 2 (mild): Secondary | ICD-10-CM

## 2013-12-21 DIAGNOSIS — K219 Gastro-esophageal reflux disease without esophagitis: Secondary | ICD-10-CM | POA: Insufficient documentation

## 2013-12-21 DIAGNOSIS — Z79899 Other long term (current) drug therapy: Secondary | ICD-10-CM | POA: Diagnosis not present

## 2013-12-21 DIAGNOSIS — G4730 Sleep apnea, unspecified: Secondary | ICD-10-CM | POA: Diagnosis present

## 2013-12-21 LAB — CBC WITH DIFFERENTIAL/PLATELET
BASOS ABS: 0 10*3/uL (ref 0.0–0.1)
Basophils Relative: 0 % (ref 0–1)
EOS PCT: 2 % (ref 0–5)
Eosinophils Absolute: 0.1 10*3/uL (ref 0.0–0.7)
HEMATOCRIT: 44.7 % (ref 39.0–52.0)
HEMOGLOBIN: 15.4 g/dL (ref 13.0–17.0)
LYMPHS ABS: 2 10*3/uL (ref 0.7–4.0)
LYMPHS PCT: 37 % (ref 12–46)
MCH: 31 pg (ref 26.0–34.0)
MCHC: 34.5 g/dL (ref 30.0–36.0)
MCV: 89.9 fL (ref 78.0–100.0)
MONO ABS: 0.3 10*3/uL (ref 0.1–1.0)
MONOS PCT: 6 % (ref 3–12)
NEUTROS ABS: 3 10*3/uL (ref 1.7–7.7)
Neutrophils Relative %: 55 % (ref 43–77)
Platelets: 158 10*3/uL (ref 150–400)
RBC: 4.97 MIL/uL (ref 4.22–5.81)
RDW: 13.6 % (ref 11.5–15.5)
WBC: 5.4 10*3/uL (ref 4.0–10.5)

## 2013-12-21 LAB — COMPREHENSIVE METABOLIC PANEL
ALBUMIN: 4.2 g/dL (ref 3.5–5.2)
ALK PHOS: 62 U/L (ref 39–117)
ALT: 56 U/L — AB (ref 0–53)
AST: 32 U/L (ref 0–37)
Anion gap: 12 (ref 5–15)
BILIRUBIN TOTAL: 0.4 mg/dL (ref 0.3–1.2)
BUN: 20 mg/dL (ref 6–23)
CHLORIDE: 106 meq/L (ref 96–112)
CO2: 28 mEq/L (ref 19–32)
Calcium: 9.1 mg/dL (ref 8.4–10.5)
Creatinine, Ser: 1.28 mg/dL (ref 0.50–1.35)
GFR calc non Af Amer: 59 mL/min — ABNORMAL LOW (ref >90)
GFR, EST AFRICAN AMERICAN: 69 mL/min — AB (ref >90)
GLUCOSE: 71 mg/dL (ref 70–99)
POTASSIUM: 4.4 meq/L (ref 3.7–5.3)
SODIUM: 146 meq/L (ref 137–147)
TOTAL PROTEIN: 7.3 g/dL (ref 6.0–8.3)

## 2013-12-21 LAB — I-STAT TROPONIN, ED: Troponin i, poc: 0 ng/mL (ref 0.00–0.08)

## 2013-12-21 LAB — HEMOGLOBIN A1C
Hgb A1c MFr Bld: 6.8 % — ABNORMAL HIGH (ref <5.7)
Mean Plasma Glucose: 148 mg/dL — ABNORMAL HIGH (ref <117)

## 2013-12-21 LAB — TROPONIN I
Troponin I: <0.3 ng/mL (ref <0.30)
Troponin I: <0.3 ng/mL (ref <0.30)

## 2013-12-21 LAB — GLUCOSE, CAPILLARY
GLUCOSE-CAPILLARY: 84 mg/dL (ref 70–99)
GLUCOSE-CAPILLARY: 151 mg/dL — AB (ref 70–99)
Glucose-Capillary: 130 mg/dL — ABNORMAL HIGH (ref 70–99)

## 2013-12-21 MED ORDER — GABAPENTIN 400 MG PO CAPS
400.0000 mg | ORAL_CAPSULE | Freq: Three times a day (TID) | ORAL | Status: DC
  Administered 2013-12-21 – 2013-12-22 (×3): 400 mg via ORAL
  Filled 2013-12-21 (×5): qty 1

## 2013-12-21 MED ORDER — NITROGLYCERIN 0.4 MG SL SUBL: 0.4000 mg | SUBLINGUAL_TABLET | SUBLINGUAL | Status: DC | PRN

## 2013-12-21 MED ORDER — ATORVASTATIN CALCIUM 40 MG PO TABS
40.0000 mg | ORAL_TABLET | Freq: Every day | ORAL | Status: DC
  Administered 2013-12-21: 40 mg via ORAL
  Filled 2013-12-21 (×2): qty 1

## 2013-12-21 MED ORDER — RANOLAZINE ER 500 MG PO TB12
500.0000 mg | ORAL_TABLET | Freq: Two times a day (BID) | ORAL | Status: DC
  Administered 2013-12-21 – 2013-12-22 (×3): 500 mg via ORAL
  Filled 2013-12-21 (×4): qty 1

## 2013-12-21 MED ORDER — ASPIRIN 325 MG PO TABS
325.0000 mg | ORAL_TABLET | Freq: Every day | ORAL | Status: DC
Start: 2013-12-22 — End: 2013-12-22
  Administered 2013-12-22: 325 mg via ORAL
  Filled 2013-12-21: qty 1

## 2013-12-21 MED ORDER — ISOSORBIDE MONONITRATE ER 60 MG PO TB24
60.0000 mg | ORAL_TABLET | Freq: Two times a day (BID) | ORAL | Status: DC
  Administered 2013-12-21 – 2013-12-22 (×3): 60 mg via ORAL
  Filled 2013-12-21 (×4): qty 1

## 2013-12-21 MED ORDER — METOPROLOL TARTRATE 50 MG PO TABS
50.0000 mg | ORAL_TABLET | Freq: Two times a day (BID) | ORAL | Status: DC
  Administered 2013-12-21 – 2013-12-22 (×3): 50 mg via ORAL
  Filled 2013-12-21 (×4): qty 1

## 2013-12-21 MED ORDER — ASPIRIN 300 MG RE SUPP: 300.0000 mg | RECTAL | Status: DC

## 2013-12-21 MED ORDER — NITROGLYCERIN 0.4 MG SL SUBL
0.4000 mg | SUBLINGUAL_TABLET | SUBLINGUAL | Status: DC | PRN
Start: 2013-12-21 — End: 2013-12-22

## 2013-12-21 MED ORDER — HEPARIN SODIUM (PORCINE) 5000 UNIT/ML IJ SOLN
5000.0000 [IU] | Freq: Three times a day (TID) | INTRAMUSCULAR | Status: DC
  Administered 2013-12-21 – 2013-12-22 (×4): 5000 [IU] via SUBCUTANEOUS
  Filled 2013-12-21 (×6): qty 1

## 2013-12-21 MED ORDER — ONDANSETRON HCL 4 MG/2ML IJ SOLN: 4.0000 mg | Freq: Four times a day (QID) | INTRAMUSCULAR | Status: DC | PRN

## 2013-12-21 MED ORDER — ZOLPIDEM TARTRATE 5 MG PO TABS: 5.0000 mg | ORAL_TABLET | Freq: Every evening | ORAL | Status: DC | PRN

## 2013-12-21 MED ORDER — ACETAMINOPHEN 325 MG PO TABS: 650.0000 mg | ORAL_TABLET | ORAL | Status: DC | PRN

## 2013-12-21 MED ORDER — ALPRAZOLAM 0.25 MG PO TABS: 0.2500 mg | ORAL_TABLET | Freq: Two times a day (BID) | ORAL | Status: DC | PRN

## 2013-12-21 MED ORDER — BUPROPION HCL ER (SR) 100 MG PO TB12
100.0000 mg | ORAL_TABLET | Freq: Two times a day (BID) | ORAL | Status: DC
  Administered 2013-12-21 – 2013-12-22 (×3): 100 mg via ORAL
  Filled 2013-12-21 (×4): qty 1

## 2013-12-21 MED ORDER — INSULIN ASPART 100 UNIT/ML ~~LOC~~ SOLN
0.0000 [IU] | Freq: Three times a day (TID) | SUBCUTANEOUS | Status: DC
  Administered 2013-12-21: 8.8 [IU] via SUBCUTANEOUS
  Administered 2013-12-22: 6 [IU] via SUBCUTANEOUS

## 2013-12-21 MED ORDER — MORPHINE SULFATE 4 MG/ML IJ SOLN
4.0000 mg | Freq: Once | INTRAMUSCULAR | Status: AC
  Administered 2013-12-21: 4 mg via INTRAVENOUS
  Filled 2013-12-21: qty 1

## 2013-12-21 MED ORDER — BUSPIRONE HCL 10 MG PO TABS
10.0000 mg | ORAL_TABLET | Freq: Three times a day (TID) | ORAL | Status: DC
  Administered 2013-12-21 – 2013-12-22 (×3): 10 mg via ORAL
  Filled 2013-12-21 (×5): qty 1

## 2013-12-21 MED ORDER — ASPIRIN 81 MG PO CHEW
324.0000 mg | CHEWABLE_TABLET | Freq: Once | ORAL | Status: AC
  Administered 2013-12-21: 324 mg via ORAL
  Filled 2013-12-21: qty 4

## 2013-12-21 MED ORDER — INSULIN PUMP
Freq: Three times a day (TID) | SUBCUTANEOUS | Status: DC
  Filled 2013-12-21: qty 1

## 2013-12-21 MED ORDER — ASPIRIN 81 MG PO CHEW: 324.0000 mg | CHEWABLE_TABLET | ORAL | Status: DC

## 2013-12-21 NOTE — ED Notes (Signed)
Pt. Stated, I started having CP yesterday on the rt side and it comes and goes. I took a Nitro 2-3 times this morning.Last dose 0600 pain went away for a little while and it came back.

## 2013-12-21 NOTE — Progress Notes (Signed)
RT notified that pt will need CPAP at HS this evening.  Diabetic coordinator paged and notified unable to find contract for pt insulin pump but pt is knowledgeable and comfortable to manage pump both MD and this writer have interviewed pt in regards to this. Coordinator will see pt first thing in AM to oversee care. Wife to bring in supplies as pt is due for change tomorrow

## 2013-12-21 NOTE — ED Provider Notes (Signed)
CSN: [id number]     Arrival date & time 12/21/13  0914 History   First MD Initiated Contact with Patient 12/21/13 [phone]     Chief Complaint  Patient presents with  . Chest Pain     (Consider location/radiation/quality/duration/timing/severity/associated sxs/prior Treatment) Patient is a 58 y.o. male presenting with chest pain. The history is provided by the patient.  Chest Pain Pain location:  R chest Pain quality: aching and dull   Pain radiates to:  Does not radiate Pain radiates to the back: no   Pain severity:  Moderate Onset quality:  Gradual Duration:  1 day Timing:  Constant Progression:  Waxing and waning Chronicity:  New Context: at rest   Relieved by:  Nitroglycerin Worsened by:  Nothing tried Associated symptoms: no abdominal pain, no anorexia, no cough, no dizziness, no fever, no headache, no lower extremity edema, no nausea, no shortness of breath, not vomiting and no weakness   Risk factors: coronary artery disease, diabetes mellitus, high cholesterol, hypertension and male sex   Risk factors: no immobilization, no prior DVT/PE, no smoking and no surgery     Past Medical History  Diagnosis Date  . Diabetes mellitus   . Hypertension   . High cholesterol   . CHF (congestive heart failure)   . OSA (obstructive sleep apnea)   . CAD (coronary artery disease)   . GERD (gastroesophageal reflux disease)   . Peripheral neuropathy    Past Surgical History  Procedure Laterality Date  . Coronary artery bypass graft    . Carpal tunnel release    . Toe surgery    . Hammer toe surgery     Family History  Problem Relation Age of Onset  . Heart attack Father   . Stroke Father   . Heart failure Mother   . Heart failure Mother   . Diabetes Mother   . Diabetes Brother   . Hypertension     History  Substance Use Topics  . Smoking status: Never Smoker   . Smokeless tobacco: Not on file  . Alcohol Use: No    Review of Systems  Constitutional: Negative for fever.   Respiratory: Negative for cough and shortness of breath.   Cardiovascular: Positive for chest pain.  Gastrointestinal: Negative for nausea, vomiting, abdominal pain and anorexia.  Neurological: Negative for dizziness, weakness and headaches.  All other systems reviewed and are negative.     Allergies  Iodine  Home Medications   Prior to Admission medications   Medication Sig Start Date End Date Taking? Authorizing Provider  aspirin 325 MG tablet Take 325 mg by mouth daily.   Yes Historical Provider, MD  buPROPion (WELLBUTRIN SR) 100 MG 12 hr tablet Take 100 mg by mouth 2 (two) times daily.   Yes Historical Provider, MD  busPIRone (BUSPAR) 10 MG tablet Take 10 mg by mouth 3 (three) times daily.   Yes Historical Provider, MD  Ergocalciferol (VITAMIN D2) 2000 UNITS TABS Take 2,000 Units by mouth.   Yes Historical Provider, MD  gabapentin (NEURONTIN) 400 MG capsule Take 400 mg by mouth 3 (three) times daily.   Yes Historical Provider, MD  insulin regular human CONCENTRATED (HUMULIN R) 500 UNIT/ML SOLN injection Inject into the skin continuous.   Yes Historical Provider, MD  isosorbide mononitrate (IMDUR) 60 MG 24 hr tablet Take 60 mg by mouth 2 (two) times daily.   Yes Historical Provider, MD  metFORMIN (GLUCOPHAGE) 1000 MG tablet Take 1,000 mg by mouth 2 (two) times daily  with a meal.   Yes Historical Provider, MD  metoprolol (LOPRESSOR) 50 MG tablet Take 50 mg by mouth 2 (two) times daily.   Yes Historical Provider, MD  ranolazine (RANEXA) 500 MG 12 hr tablet Take 500 mg by mouth 2 (two) times daily.   Yes Historical Provider, MD  simvastatin (ZOCOR) 80 MG tablet Take 80 mg by mouth at bedtime.   Yes Historical Provider, MD   BP 128/74  Pulse 67  Temp(Src) 97.6 F (36.4 C) (Oral)  Resp 22  Ht 6' (1.829 m)  Wt 265 lb (120.203 kg)  BMI 35.93 kg/m2  SpO2 96% Physical Exam  Nursing note and vitals reviewed. Constitutional: He is oriented to person, place, and time. He appears  well-developed and well-nourished. No distress.  HENT:  Head: Normocephalic and atraumatic.  Mouth/Throat: Oropharynx is clear and moist.  Eyes: Conjunctivae and EOM are normal. Pupils are equal, round, and reactive to light.  Neck: Normal range of motion. Neck supple.  Cardiovascular: Normal rate, regular rhythm and intact distal pulses.   No murmur heard. Pulmonary/Chest: Effort normal and breath sounds normal. No respiratory distress. He has no wheezes. He has no rales. He exhibits no tenderness.  Abdominal: Soft. He exhibits no distension. There is no tenderness. There is no rebound and no guarding.  Musculoskeletal: Normal range of motion. He exhibits no edema and no tenderness.  Neurological: He is alert and oriented to person, place, and time.  Skin: Skin is warm and dry. No rash noted. No erythema.  Psychiatric: He has a normal mood and affect. His behavior is normal.    ED Course  Procedures (including critical care time) Labs Review Labs Reviewed  COMPREHENSIVE METABOLIC PANEL - Abnormal; Notable for the following:    ALT 56 (*)    GFR calc non Af Amer 59 (*)    GFR calc Af Amer 69 (*)    All other components within normal limits  CBC WITH DIFFERENTIAL  Rosezena SensorI-STAT TROPOININ, ED    Imaging Review Dg Chest Port 1 View  12/21/2013   CLINICAL DATA:  Right chest pain.  EXAM: PORTABLE CHEST - 1 VIEW  COMPARISON:  10/19/2009  FINDINGS: Single view of the chest demonstrates slightly low lung volumes but similar to the previous examination. Patient has median sternotomy wires. Few densities at the left lung base could represent atelectasis. No focal airspace disease or pulmonary edema. Heart size is within normal limits.  IMPRESSION: No acute chest findings.   Electronically Signed   By: Richarda OverlieAdam  Henn M.D.   On: 12/21/2013 10:17     EKG Interpretation   Date/Time:  Sunday December 21 2013 09:20:17 EDT Ventricular Rate:  68 PR Interval:  270 QRS Duration: 104 QT Interval:  396 QTC  Calculation: 421 R Axis:   -24 Text Interpretation:  Sinus rhythm with 1st degree A-V block Low voltage  QRS Incomplete right bundle branch block T wave abnormality, consider  inferior ischemia T wave abnormality, consider anterolateral ischemia No  significant change since last tracing Confirmed by Anitra LauthPLUNKETT  MD, Alphonzo LemmingsWHITNEY  [phone]([id number]) on 12/21/2013 9:26:19 AM      MDM   Final diagnoses:  Precordial pain    Patient with a significant history of coronary artery disease status post 5 vessel bypass 6 years ago who presents today with 24 hours of intermittent chest tightness on the right side that improves with nitroglycerin and then returns. He denies nausea, vomiting, diaphoresis or shortness of breath. No recent travel, surgical procedures, immobilization,  leg pain or swelling. No prior history of PE. Pain is not reproduced with deep breathing or palpation or movement.  [facility] and last had a nuclear medicine scan about a year ago that was within normal limits. He has not had catheterization since his CABG. On arrival here patient has chest pain that is not reproduced by palpation of the chest wall. His EKG shows incomplete right bundle branch block which is not significantly changed from prior EKG.  Pacing given aspirin and nitroglycerin and morphine for pain control. He is on an insulin pump and his initial blood sugar this morning was 52. He has not eaten any was given something to eat.  Will follow blood sugars closely.  11:14 AM All labs wnl.  Pain is resolved after morphine.  Discussed with cards and they will evaluate.  Gwyneth SproutWhitney Corynn Solberg, MD 12/21/13 1114

## 2013-12-21 NOTE — Progress Notes (Signed)
Inpatient Diabetes Program Recommendations  AACE/ADA: New Consensus Statement on Inpatient Glycemic Control (2013)  Target Ranges:  Prepandial:   less than 140 mg/dL      Peak postprandial:   less than 180 mg/dL (1-2 hours)      Critically ill patients:  140 - 180 mg/dL   Received page regarding 58 year old male admitted with CP. On insulin pump and wife to bring supplies to change site on 7/6 am. RN was unable to pull up Pt Contract for insulin pump. RN states pt very knowledgeable regarding his pump and blood sugars are acceptable. Will f/u with contract tomorrow am. RN instructed to follow insulin pump policy - record boluses in doc flowsheet, pump assessment each shift and use hospital meter for adjustments. Voiced understanding. Thank you. Ailene Ardshonda Maxi Rodas, RD, LDN, CDE Inpatient Diabetes Coordinator [phone]

## 2013-12-21 NOTE — H&P (Addendum)
Admit date: 12/21/2013 Primary Physician  PROVIDER NOT IN SYSTEM Primary Cardiologist  [facility]. [place]  CC: Chest pain  HPI: 58 year old male with coronary artery disease status post bypass surgery 08/27/07 by Dr. Laneta SimmersBartle (x5, using LIMA to second diagonal branch of LAD, SVG to first diagonal branch of LAD, SVG to second obtuse marginal branch of left circumflex, sequential SVG to posterior lateral branch of right coronary artery and distal left circumflex artery) who presents with right-sided chest discomfort, aching/dullness with no radiation, moderate in severity it has been waxing and waning relieved by nitroglycerin with no associated shortness of breath, fevers, nausea, vomiting.  He says that normally his angina is on his left side and [facility]. He thinks that in December of last year he underwent a nuclear stress test that he was told was normal.  Currently he is chest pain-free.    PMH:   Past Medical History  Diagnosis Date  . Diabetes mellitus   . Hypertension   . High cholesterol   . CHF (congestive heart failure)   . OSA (obstructive sleep apnea)   . CAD (coronary artery disease)   . GERD (gastroesophageal reflux disease)   . Peripheral neuropathy     PSH:   Past Surgical History  Procedure Laterality Date  . Coronary artery bypass graft    . Carpal tunnel release    . Toe surgery    . Hammer toe surgery     Allergies:  Iodine Prior to Admit Meds:   Prior to Admission medications   Medication Sig Start Date End Date Taking? Authorizing Provider  aspirin 325 MG tablet Take 325 mg by mouth daily.   Yes Historical Provider, MD  buPROPion (WELLBUTRIN SR) 100 MG 12 hr tablet Take 100 mg by mouth 2 (two) times daily.   Yes Historical Provider, MD  busPIRone (BUSPAR) 10 MG tablet Take 10 mg by mouth 3 (three) times daily.   Yes Historical Provider, MD  Ergocalciferol (VITAMIN D2) 2000 UNITS TABS Take 2,000 Units by mouth.    Yes Historical Provider, MD  gabapentin (NEURONTIN) 400 MG capsule Take 400 mg by mouth 3 (three) times daily.   Yes Historical Provider, MD  insulin regular human CONCENTRATED (HUMULIN R) 500 UNIT/ML SOLN injection Inject into the skin continuous.   Yes Historical Provider, MD  isosorbide mononitrate (IMDUR) 60 MG 24 hr tablet Take 60 mg by mouth 2 (two) times daily.   Yes Historical Provider, MD  metFORMIN (GLUCOPHAGE) 1000 MG tablet Take 1,000 mg by mouth 2 (two) times daily with a meal.   Yes Historical Provider, MD  metoprolol (LOPRESSOR) 50 MG tablet Take 50 mg by mouth 2 (two) times daily.   Yes Historical Provider, MD  ranolazine (RANEXA) 500 MG 12 hr tablet Take 500 mg by mouth 2 (two) times daily.   Yes Historical Provider, MD  simvastatin (ZOCOR) 80 MG tablet Take 80 mg by mouth at bedtime.   Yes Historical Provider, MD   Fam HX:    Family History  Problem Relation Age of Onset  . Heart attack Father   . Stroke Father   . Heart failure Mother   . Heart failure Mother   . Diabetes Mother   . Diabetes Brother   . Hypertension     Social HX:    History   Social History  . Marital Status: Married    Spouse Name: N/A    Number  of Children: 0  . Years of Education: N/A   Occupational History  . Not on file.   Social History Main Topics  . Smoking status: Never Smoker   . Smokeless tobacco: Not on file  . Alcohol Use: No  . Drug Use: No  . Sexual Activity:    Other Topics Concern  . Not on file   Social History Narrative  . No narrative on file     ROS:  All 11 ROS were addressed and are negative except what is stated in the HPI   Physical Exam: Blood pressure 142/69, pulse 67, temperature 97.6 F (36.4 C), temperature source Oral, resp. rate 23, height 6' (1.829 m), weight 265 lb (120.203 kg), SpO2 98.00%.   General: Well developed, well nourished, in no acute distress Head: Eyes PERRLA, No xanthomas.   Normal cephalic and atramatic  Lungs:  Clear  bilaterally to auscultation and percussion. Normal respiratory effort. No wheezes, no rales. Heart:  HRRR S1 S2 Pulses are 2+ & equal. No murmurs, rubs or gallops.             No carotid bruit. No JVD.  No abdominal bruits. Chest scar noted from prior bypass Abdomen: Bowel sounds are positive, abdomen soft and non-tender without masses or                 Hernia's noted. No hepatosplenomegaly. Obese Msk:  Back normal, normal gait. Normal strength and tone for age. Extremities:  No clubbing, cyanosis or edema.  DP +1, varicosities. Neuro: Alert and oriented X 3, non-focal, MAE x 4, decreased sensation bilateral lower extremities, uses a cane for ambulation. GU: Deferred Rectal: Deferred Psych:  Good affect, responds appropriately         Labs:   Lab Results  Component Value Date   WBC 5.4 12/21/2013   HGB 15.4 12/21/2013   HCT 44.7 12/21/2013   MCV 89.9 12/21/2013   PLT 158 12/21/2013    Recent Labs Lab 12/21/13 0945  NA 146  K 4.4  CL 106  CO2 28  BUN 20  CREATININE 1.28  CALCIUM 9.1  PROT 7.3  BILITOT 0.4  ALKPHOS 62  ALT 56*  AST 32  GLUCOSE 71    Lab Results  Component Value Date   CHOL  Value: 131        ATP III CLASSIFICATION:  <200     mg/dL   Desirable  [id number]  mg/dL   Borderline High  >=332>=240    mg/dL   High 9/51/88416/03/2008   HDL 27* 11/27/2007   LDLCALC  Value: 77        Total Cholesterol/HDL:CHD Risk Coronary Heart Disease Risk Table                     Men   Women  1/2 Average Risk   3.4   3.3 11/27/2007   TRIG 134 11/27/2007   No results found for this basename: DDIMER     Radiology:  Dg Chest Port 1 View  12/21/2013   CLINICAL DATA:  Right chest pain.  EXAM: PORTABLE CHEST - 1 VIEW  COMPARISON:  10/19/2009  FINDINGS: Single view of the chest demonstrates slightly low lung volumes but similar to the previous examination. Patient has median sternotomy wires. Few densities at the left lung base could represent atelectasis. No focal airspace disease or pulmonary edema.  Heart size is within normal limits.  IMPRESSION: No acute chest findings.   Electronically Signed  By: Richarda Overlie M.D.   On: 12/21/2013 10:17   Personally viewed.   EKG:  12/21/13-sinus rhythm, first degree AV block, incomplete right bundle branch block, T wave inversion consider anterolateral ischemia. According to emergency room, no change from prior tracing although I am not able to review a prior tracing on EPIC. Personally viewed.  Cardiac MRI: 2012-no scar, mention of possible overriding aorta. No systolic anterior motion of mitral valve. EF 56%  ASSESSMENT/PLAN:   58 year old male with coronary artery disease status post bypass x5 in 2012 with diabetes, hypertension, hyperlipidemia here with chest discomfort.  1. Chest pain-somewhat atypical given its right-sided appearance however nitroglycerin has relieved discomfort and he has not complained of chest discomfort in quite some time. He does not have any associated shortness of breath, long travels, suspicion for pulmonary embolism is low. Thus far, troponin is normal. Hemoglobin is normal. Creatinine is 1.28. I will make him n.p.o. past midnight and order a nuclear stress test for further evaluation of ischemia given the atypical nature of his chest discomfort. If symptoms worsen or become more worrisome, or if troponin becomes positive, we will proceed with cardiac catheterization. He did ask if he could go home and come back and I do not think that this was a good idea. Continue with ranolazine.  2. Diabetes with peripheral neuropathy-check hemoglobin A1c. hold metformin. Insulin sliding scale.  3. Hypertension-continue to monitor. Currently stable. Medications reviewed.  4. Hyperlipidemia-I will change to atorvastatin 40 mg since he was previously on high-dose simvastatin 80 mg. No adverse symptoms.  5. Obesity-encourage weight loss.  N.p.o. past midnight for stress test.  Donato Schultz, MD  12/21/2013  12:20 PM

## 2013-12-21 NOTE — ED Notes (Signed)
Cardiology at bedside.

## 2013-12-22 ENCOUNTER — Observation Stay (HOSPITAL_COMMUNITY): Payer: Non-veteran care

## 2013-12-22 DIAGNOSIS — N182 Chronic kidney disease, stage 2 (mild): Secondary | ICD-10-CM

## 2013-12-22 DIAGNOSIS — R072 Precordial pain: Secondary | ICD-10-CM | POA: Diagnosis not present

## 2013-12-22 DIAGNOSIS — E119 Type 2 diabetes mellitus without complications: Secondary | ICD-10-CM | POA: Diagnosis not present

## 2013-12-22 DIAGNOSIS — E7800 Pure hypercholesterolemia, unspecified: Secondary | ICD-10-CM | POA: Diagnosis not present

## 2013-12-22 DIAGNOSIS — E1121 Type 2 diabetes mellitus with diabetic nephropathy: Secondary | ICD-10-CM | POA: Diagnosis present

## 2013-12-22 DIAGNOSIS — I10 Essential (primary) hypertension: Secondary | ICD-10-CM | POA: Diagnosis not present

## 2013-12-22 DIAGNOSIS — R079 Chest pain, unspecified: Secondary | ICD-10-CM

## 2013-12-22 LAB — LIPID PANEL
Cholesterol: 120 mg/dL (ref 0–200)
HDL: 26 mg/dL — ABNORMAL LOW (ref >39)
LDL Cholesterol: 48 mg/dL (ref 0–99)
Total CHOL/HDL Ratio: 4.6 RATIO
Triglycerides: 228 mg/dL — ABNORMAL HIGH (ref <150)
VLDL: 46 mg/dL — ABNORMAL HIGH (ref 0–40)

## 2013-12-22 LAB — BASIC METABOLIC PANEL
Anion gap: 12 (ref 5–15)
BUN: 20 mg/dL (ref 6–23)
CO2: 28 mEq/L (ref 19–32)
Calcium: 8.6 mg/dL (ref 8.4–10.5)
Chloride: 100 mEq/L (ref 96–112)
Creatinine, Ser: 1.39 mg/dL — ABNORMAL HIGH (ref 0.50–1.35)
GFR calc Af Amer: 62 mL/min — ABNORMAL LOW (ref >90)
GFR, EST NON AFRICAN AMERICAN: 54 mL/min — AB (ref >90)
Glucose, Bld: 219 mg/dL — ABNORMAL HIGH (ref 70–99)
POTASSIUM: 3.9 meq/L (ref 3.7–5.3)
SODIUM: 140 meq/L (ref 137–147)

## 2013-12-22 LAB — CBC
HEMATOCRIT: 42.7 % (ref 39.0–52.0)
Hemoglobin: 14.2 g/dL (ref 13.0–17.0)
MCH: 30.1 pg (ref 26.0–34.0)
MCHC: 33.3 g/dL (ref 30.0–36.0)
MCV: 90.7 fL (ref 78.0–100.0)
Platelets: 154 10*3/uL (ref 150–400)
RBC: 4.71 MIL/uL (ref 4.22–5.81)
RDW: 13.4 % (ref 11.5–15.5)
WBC: 6.2 10*3/uL (ref 4.0–10.5)

## 2013-12-22 LAB — GLUCOSE, CAPILLARY
GLUCOSE-CAPILLARY: 106 mg/dL — AB (ref 70–99)
Glucose-Capillary: 195 mg/dL — ABNORMAL HIGH (ref 70–99)
Glucose-Capillary: 163 mg/dL — ABNORMAL HIGH (ref 70–99)

## 2013-12-22 LAB — TROPONIN I

## 2013-12-22 MED ORDER — TECHNETIUM TC 99M SESTAMIBI GENERIC - CARDIOLITE
10.0000 | Freq: Once | INTRAVENOUS | Status: AC | PRN
  Administered 2013-12-22: 10 via INTRAVENOUS

## 2013-12-22 MED ORDER — REGADENOSON 0.4 MG/5ML IV SOLN
INTRAVENOUS | Status: AC
  Administered 2013-12-22: 0.4 mg via INTRAVENOUS
  Filled 2013-12-22: qty 5

## 2013-12-22 MED ORDER — REGADENOSON 0.4 MG/5ML IV SOLN
0.4000 mg | Freq: Once | INTRAVENOUS | Status: AC
  Administered 2013-12-22: 0.4 mg via INTRAVENOUS
  Filled 2013-12-22: qty 5

## 2013-12-22 MED ORDER — TECHNETIUM TC 99M SESTAMIBI - CARDIOLITE
30.0000 | Freq: Once | INTRAVENOUS | Status: AC | PRN
  Administered 2013-12-22: 10:00:00 30 via INTRAVENOUS

## 2013-12-22 NOTE — Progress Notes (Signed)
Patient Name: Jordan Burns Date of Encounter: 12/22/2013  Principal Problem:   Chest pain with moderate risk of acute coronary syndrome Active Problems:   HYPERLIPIDEMIA   HYPERTENSION   CAD- CABG x 5 2009   OBSTRUCTIVE SLEEP APNEA- on C-pap   Morbid obesity   Diabetes mellitus with nephropathy   Chronic renal insufficiency, stage II (mild)    Patient Profile: 58 yo male w/ CABG 2009, nuc stress 05/2013 that was OK, DM, HTN, HLD, OSA, CHF, GERD was admitted 07/05 with chest pain.  SUBJECTIVE: No more chest pain, no SOB  OBJECTIVE Filed Vitals:   12/21/13 2020 12/21/13 2224 12/21/13 2352 12/22/13 0546  BP: 129/80  123/68 105/58  Pulse: 59 68 67 61  Temp: 97.7 F (36.5 C)   98.5 F (36.9 C)  TempSrc: Oral   Oral  Resp: 16 16  18   Height:      Weight:      SpO2: 98% 97%  96%    Intake/Output Summary (Last 24 hours) at 12/22/13 0852 Last data filed at 12/22/13 0500  Gross per 24 hour  Intake    240 ml  Output      0 ml  Net    240 ml   Filed Weights   12/21/13 0920  Weight: 265 lb (120.203 kg)    PHYSICAL EXAM General: Well developed, well nourished, male in no acute distress. Head: Normocephalic, atraumatic.  Neck: Supple without bruits, JVD not elevated Lungs:  Resp regular and unlabored, CTA. Heart: RRR, S1, S2, no S3, S4, 2/6 murmur; no rub. Abdomen: Soft, non-tender, non-distended, BS + x 4.  Extremities: No clubbing, cyanosis, no edema.  Neuro: Alert and oriented X 3. Moves all extremities spontaneously. Psych: Normal affect.  LABS: CBC: Recent Labs  12/21/13 0945 12/22/13 0335  WBC 5.4 6.2  NEUTROABS 3.0  --   HGB 15.4 14.2  HCT 44.7 42.7  MCV 89.9 90.7  PLT 158 154   Basic Metabolic Panel: Recent Labs  12/21/13 0945 12/22/13 0335  NA 146 140  K 4.4 3.9  CL 106 100  CO2 28 28  GLUCOSE 71 219*  BUN 20 20  CREATININE 1.28 1.39*  CALCIUM 9.1 8.6   Liver Function Tests: Recent Labs  12/21/13 0945  AST 32  ALT 56*    ALKPHOS 62  BILITOT 0.4  PROT 7.3  ALBUMIN 4.2   Cardiac Enzymes: Recent Labs  12/21/13 1511 12/21/13 2211 12/22/13 0335  TROPONINI <0.30 <0.30 <0.30    Recent Labs  12/21/13 0956  TROPIPOC 0.00   Hemoglobin A1C: Recent Labs  12/21/13 0945  HGBA1C 6.8*   Fasting Lipid Panel: Recent Labs  12/22/13 0335  CHOL 120  HDL 26*  LDLCALC 48  TRIG 161228*  CHOLHDL 4.6   TELE:  SR, sinus brady, seen in nuc med    Radiology/Studies: Dg Chest Port 1 View 12/21/2013   CLINICAL DATA:  Right chest pain.  EXAM: PORTABLE CHEST - 1 VIEW  COMPARISON:  10/19/2009  FINDINGS: Single view of the chest demonstrates slightly low lung volumes but similar to the previous examination. Patient has median sternotomy wires. Few densities at the left lung base could represent atelectasis. No focal airspace disease or pulmonary edema. Heart size is within normal limits.  IMPRESSION: No acute chest findings.   Electronically Signed   By: Richarda OverlieAdam  Henn M.D.   On: 12/21/2013 10:17   Current Medications:  . aspirin  325 mg Oral Daily  .  atorvastatin  40 mg Oral q1800  . buPROPion  100 mg Oral BID  . busPIRone  10 mg Oral TID  . gabapentin  400 mg Oral TID  . heparin  5,000 Units Subcutaneous 3 times per day  . insulin aspart  0-20 Units Subcutaneous TID WC  . insulin pump   Subcutaneous TID AC & HS  . isosorbide mononitrate  60 mg Oral BID  . metoprolol  50 mg Oral BID  . ranolazine  500 mg Oral BID  . regadenoson  0.4 mg Intravenous Once      ASSESSMENT AND PLAN: Principal Problem:   Chest pain with moderate risk of acute coronary syndrome - ez negative for MI, nuc stress today, will do Lexi as pt says does not walk that well. D/c if negative  Active Problems:   HYPERLIPIDEMIA - on lipitor 40 mg, LDL OK, HDL 26    HYPERTENSION - Good control on current Rx    CAD- CABG x 5 2009 - CRF control    OBSTRUCTIVE SLEEP APNEA- on C-pap    Morbid obesity - BMI now 36    Diabetes mellitus with  nephropathy - A1c is OK, f/u with primary care    Chronic renal insufficiency, stage II (mild) - Cr up slightly today, follow; encourage fluids po if EF OK.  Signed, Jordan Burns , PA-C 8:52 AM 12/22/2013   The patient was seen, examined and discussed with Jordan Demarkhonda Barrett, PA-C and I agree with the above.   58 year old patient with Burns/o CAD, CABG who was admitted with atypical right sided chest pain. ACS ruled out. Stress test today, if negative discharge home. BP controlled, continue aspirin, metoprolol, statin and ranolazine.  Jordan Burns, Jordan Burns 12/22/2013

## 2013-12-22 NOTE — Progress Notes (Signed)
Pt assessment unchanged from this am. D/d'd via wheelchair to private vehicle in stable condition

## 2013-12-22 NOTE — Discharge Summary (Signed)
CARDIOLOGY DISCHARGE SUMMARY   Patient ID: Jordan Burns MRN: [id number] DOB/AGE: 60-Feb-1957 60 y.o.  Admit date: 12/21/2013 Discharge date: 12/24/2013  PCP: PROVIDER NOT IN SYSTEM Primary Cardiologist: Was Dr. [place], now the [facility]   Primary Discharge Diagnosis:  Chest pain with moderate risk of acute coronary syndrome Secondary Discharge Diagnosis:    HYPERLIPIDEMIA   HYPERTENSION   CAD- CABG x 5 2009   OBSTRUCTIVE SLEEP APNEA- on C-pap   Morbid obesity   Diabetes mellitus with nephropathy   Chronic renal insufficiency, stage II (mild)  Procedures: [facility]  Hospital Course: Jordan Burns is a 58 y.o. male with a history of CAD. [facility]. He had prolonged chest pain and came to the hospital for further evaluation and treatment.  His cardiac enzymes were negative for MI. His ECG was without acute changes and his symptoms resolved. A Lexiscan Cardiolite was performed on 07/06, results are below. There was no scar or ischemia and his EF was 59%. No further inpatient workup is indicated and he is considered stable for discharge, to follow up with primary care and with cardiology as needed.  Labs:   Lab Results  Component Value Date   WBC 6.2 12/22/2013   HGB 14.2 12/22/2013   HCT 42.7 12/22/2013   MCV 90.7 12/22/2013   PLT 154 12/22/2013     Recent Labs Lab 12/21/13 0945 12/22/13 0335  NA 146 140  K 4.4 3.9  CL 106 100  CO2 28 28  BUN 20 20  CREATININE 1.28 1.39*  CALCIUM 9.1 8.6  PROT 7.3  --   BILITOT 0.4  --   ALKPHOS 62  --   ALT 56*  --   AST 32  --   GLUCOSE 71 219*    Recent Labs  12/21/13 1511 12/21/13 2211 12/22/13 0335  TROPONINI <0.30 <0.30 <0.30   Lipid Panel     Component Value Date/Time   CHOL 120 12/22/2013 0335   TRIG 228* 12/22/2013 0335   HDL 26* 12/22/2013 0335   CHOLHDL 4.6 12/22/2013 0335   VLDL 46* 12/22/2013 0335   LDLCALC 48 12/22/2013 0335      Radiology: Dg Chest Port 1  View 12/21/2013   CLINICAL DATA:  Right chest pain.  EXAM: PORTABLE CHEST - 1 VIEW  COMPARISON:  10/19/2009  FINDINGS: Single view of the chest demonstrates slightly low lung volumes but similar to the previous examination. Patient has median sternotomy wires. Few densities at the left lung base could represent atelectasis. No focal airspace disease or pulmonary edema. Heart size is within normal limits.  IMPRESSION: No acute chest findings.   Electronically Signed   By: Richarda OverlieAdam  Henn M.D.   On: 12/21/2013 10:17  Nm Myocar Multi W/spect W/wall Motion / Ef  12/22/2013   CLINICAL DATA:  58 year old with coronary artery disease status post bypass surgery with right-sided atypical chest pain.  EXAM: MYOCARDIAL IMAGING WITH SPECT (REST AND PHARMACOLOGIC-STRESS)  GATED LEFT VENTRICULAR WALL MOTION STUDY  LEFT VENTRICULAR EJECTION FRACTION  TECHNIQUE: Standard myocardial SPECT imaging was performed after resting intravenous injection of 10 mCi Tc-6548m sestamibi. Subsequently, intravenous infusion of Lexiscan was performed under the supervision of the Cardiology staff. At peak effect of the drug, 30 mCi Tc-6748m sestamibi was injected intravenously and standard myocardial SPECT imaging was performed. Quantitative gated imaging was also performed to evaluate left ventricular wall motion, and estimate left ventricular ejection fraction.  COMPARISON:  None.  FINDINGS: Raw images demonstrate mild diaphragmatic attenuation. There is subtle decreased uptake along the basal inferior wall, suggestive of diaphragmatic attenuation seen at both rest and stress. Overall homogeneous radiotracer uptake with no evidence of ischemia identified. Ejection fraction 59%, no wall motion abnormalities.  IMPRESSION: Low risk stress test, no ischemia, ejection fraction calculated 59% with no significant wall motion abnormalities.   Electronically Signed   By: Donato SchultzMark  Skains   On: 12/22/2013 15:40   EKG: 22-Dec-2013 06:12:53   Sinus bradycardia with 1st  degree A-V block Low voltage QRS Incomplete right bundle branch block Inferior infarct , age undetermined ST & T wave abnormality, anterolateral leads Vent. rate 56 BPM PR interval 330 ms QRS duration 108 ms QT/QTc 424/409 ms P-R-T axes 17 -28 148   FOLLOW UP PLANS AND APPOINTMENTS Allergies  Allergen Reactions  . Iodine     topical     Medication List         aspirin 325 MG tablet  Take 325 mg by mouth daily.     buPROPion 100 MG 12 hr tablet  Commonly known as:  WELLBUTRIN SR  Take 100 mg by mouth 2 (two) times daily.     busPIRone 10 MG tablet  Commonly known as:  BUSPAR  Take 10 mg by mouth 3 (three) times daily.     gabapentin 400 MG capsule  Commonly known as:  NEURONTIN  Take 400 mg by mouth 3 (three) times daily.     insulin regular human CONCENTRATED 500 UNIT/ML Soln injection  Commonly known as:  HUMULIN R  Inject into the skin continuous.     isosorbide mononitrate 60 MG 24 hr tablet  Commonly known as:  IMDUR  Take 60 mg by mouth 2 (two) times daily.     metFORMIN 1000 MG tablet  Commonly known as:  GLUCOPHAGE  Take 1,000 mg by mouth 2 (two) times daily with a meal.     metoprolol 50 MG tablet  Commonly known as:  LOPRESSOR  Take 50 mg by mouth 2 (two) times daily.     ranolazine 500 MG 12 hr tablet  Commonly known as:  RANEXA  Take 500 mg by mouth 2 (two) times daily.     simvastatin 80 MG tablet  Commonly known as:  ZOCOR  Take 80 mg by mouth at bedtime.     Vitamin D2 2000 UNITS Tabs  Take 2,000 Units by mouth.        Discharge Instructions   Diet - low sodium heart healthy    Complete by:  As directed      Diet Carb Modified    Complete by:  As directed      Increase activity slowly    Complete by:  As directed           Follow-up Information   Follow up with [facility]. (As scheduled)    Specialty:  General Practice   Contact information:   [address] [place] [phone]       Follow  up with Peter SwazilandJordan, MD. (As needed)    Specialty:  Cardiology   Contact information:   [address] [place] [id number] [phone]       BRING ALL MEDICATIONS WITH YOU TO FOLLOW UP APPOINTMENTS  Time spent with patient to include physician time: 48 min Signed: Theodore Demarkhonda Lindie Roberson, PA-C 12/24/2013, 1:55 PM Co-Sign MD

## 2013-12-22 NOTE — Progress Notes (Signed)
Lexiscan CL performed 

## 2013-12-24 NOTE — Discharge Summary (Signed)
[name], Sharone Picchi H 12/24/2013

## 2015-01-14 ENCOUNTER — Encounter (HOSPITAL_BASED_OUTPATIENT_CLINIC_OR_DEPARTMENT_OTHER): Payer: Self-pay | Admitting: *Deleted

## 2015-01-14 ENCOUNTER — Emergency Department (HOSPITAL_BASED_OUTPATIENT_CLINIC_OR_DEPARTMENT_OTHER)
Admission: EM | Admit: 2015-01-14 | Discharge: 2015-01-14 | Disposition: A | Discharge status: Home / Self Care | Payer: Managed Care, Other (non HMO) | Attending: Emergency Medicine | Admitting: Emergency Medicine

## 2015-01-14 DIAGNOSIS — Z79899 Other long term (current) drug therapy: Secondary | ICD-10-CM | POA: Diagnosis not present

## 2015-01-14 DIAGNOSIS — Y9389 Activity, other specified: Secondary | ICD-10-CM | POA: Insufficient documentation

## 2015-01-14 DIAGNOSIS — Z7982 Long term (current) use of aspirin: Secondary | ICD-10-CM | POA: Insufficient documentation

## 2015-01-14 DIAGNOSIS — Z794 Long term (current) use of insulin: Secondary | ICD-10-CM | POA: Insufficient documentation

## 2015-01-14 DIAGNOSIS — X58XXXA Exposure to other specified factors, initial encounter: Secondary | ICD-10-CM | POA: Diagnosis not present

## 2015-01-14 DIAGNOSIS — S91202A Unspecified open wound of left great toe with damage to nail, initial encounter: Secondary | ICD-10-CM | POA: Diagnosis not present

## 2015-01-14 DIAGNOSIS — S99922A Unspecified injury of left foot, initial encounter: Secondary | ICD-10-CM | POA: Diagnosis present

## 2015-01-14 DIAGNOSIS — Y998 Other external cause status: Secondary | ICD-10-CM | POA: Diagnosis not present

## 2015-01-14 DIAGNOSIS — I509 Heart failure, unspecified: Secondary | ICD-10-CM | POA: Insufficient documentation

## 2015-01-14 DIAGNOSIS — I2510 Atherosclerotic heart disease of native coronary artery without angina pectoris: Secondary | ICD-10-CM | POA: Diagnosis not present

## 2015-01-14 DIAGNOSIS — E1121 Type 2 diabetes mellitus with diabetic nephropathy: Secondary | ICD-10-CM | POA: Diagnosis not present

## 2015-01-14 DIAGNOSIS — I10 Essential (primary) hypertension: Secondary | ICD-10-CM | POA: Insufficient documentation

## 2015-01-14 DIAGNOSIS — Z951 Presence of aortocoronary bypass graft: Secondary | ICD-10-CM | POA: Insufficient documentation

## 2015-01-14 DIAGNOSIS — Y9289 Other specified places as the place of occurrence of the external cause: Secondary | ICD-10-CM | POA: Insufficient documentation

## 2015-01-14 DIAGNOSIS — E780 Pure hypercholesterolemia: Secondary | ICD-10-CM | POA: Insufficient documentation

## 2015-01-14 DIAGNOSIS — Z8719 Personal history of other diseases of the digestive system: Secondary | ICD-10-CM | POA: Insufficient documentation

## 2015-01-14 DIAGNOSIS — IMO0001 Reserved for inherently not codable concepts without codable children: Secondary | ICD-10-CM

## 2015-01-14 MED ORDER — CEPHALEXIN 500 MG PO CAPS: 500.0000 mg | ORAL_CAPSULE | Freq: Four times a day (QID) | ORAL | Status: AC

## 2015-01-14 MED ORDER — CEPHALEXIN 250 MG PO CAPS
500.0000 mg | ORAL_CAPSULE | Freq: Once | ORAL | Status: AC
  Administered 2015-01-14: 500 mg via ORAL
  Filled 2015-01-14 (×2): qty 2

## 2015-01-14 NOTE — ED Notes (Signed)
Post op boot placed on L. Foot.

## 2015-01-14 NOTE — ED Notes (Signed)
Pulled his left great toe nail off.

## 2015-01-14 NOTE — Discharge Instructions (Signed)
Fingernail or Toenail Loss All or part of your fingernail or toenail has been lost. This may or may not grow back as a normal nail. A special non-stick bandage has been put on your finger or toe tightly to prevent bleeding. HOME CARE INSTRUCTIONS  The tips of fingers and toes are full of nerves and injuries are often very painful. The following will help you decrease the pain and obtain the best outcome.  Keep your hand or foot elevated above your heart to relieve pain and swelling. This will require lying in bed or on a couch with the hand or leg on pillows or sitting in a recliner with the leg up. Letting your hand or leg dangle may increase swelling, slow healing and cause throbbing pain.  Keep your dressing dry and clean.  Change your bandage in 24 hours after going home.  After your bandage is changed, soak your hand or foot in warm soapy water for 10 to 20 minutes. Do this 3 times per day. This helps reduce pain and swelling. After soaking, apply a clean, dry bandage. Change your bandage if it is wet or dirty.  Only take over-the-counter or prescription medicines for pain, discomfort, or fever as directed by your caregiver.  See your caregiver as needed for problems. SEEK IMMEDIATE MEDICAL CARE IF:   You have increased pain, swelling, drainage, or bleeding.  You have a fever. MAKE SURE YOU:   Understand these instructions.  Will watch your condition.  Will get help right away if you are not doing well or get worse. Document Released: 04/27/2006 Document Revised: 08/28/2011 Document Reviewed: 07/17/2006 ExitCare Patient Information 2015 [employer]. This information is not intended to replace advice given to you by your health care provider. Make sure you discuss any questions you have with your health care provider.  

## 2015-01-14 NOTE — ED Provider Notes (Signed)
CSN: [id number]     Arrival date & time 01/14/15  1843 History   First MD Initiated Contact with Patient 01/14/15 1918     Chief Complaint  Patient presents with  . Foot Injury     (Consider location/radiation/quality/duration/timing/severity/associated sxs/prior Treatment) HPI    PCP: PROVIDER NOT IN SYSTEM Blood pressure 118/73, pulse 63, temperature 97.6 F (36.4 C), temperature source Oral, resp. rate 18, height 6' (1.829 m), weight 255 lb (115.667 kg), SpO2 100 %.  Jordan Burns is a 59 y.o.male with a significant PMH of  Diabetes, hypertension, CHF, coronary artery disease, GERD, peripheral neuropathy presents to the ER with complaints of  Loss of left great toe nail.  Patient reports he is going to get into the shower when he pulled his shoe off and it ripped off his greater toenail. It is still somewhat attached on the lateral edge. It bled a significant amount but is not currently bleeding. The patient has no feeling in his feet and therefore isn't and no pain. He denies any other complaints. He reports he did not hit his foot anywhere.   The patient denies diaphoresis, fever, headache, weakness (general or focal), confusion, change of vision,  neck pain, dysphagia, aphagia, chest pain, shortness of breath,  back pain, abdominal pains, nausea, vomiting, diarrhea, lower extremity swelling, rash.   Past Medical History  Diagnosis Date  . Diabetes mellitus   . Hypertension   . High cholesterol   . CHF (congestive heart failure)   . OSA (obstructive sleep apnea)   . CAD (coronary artery disease)   . GERD (gastroesophageal reflux disease)   . Peripheral neuropathy    Past Surgical History  Procedure Laterality Date  . Coronary artery bypass graft    . Carpal tunnel release    . Toe surgery    . Hammer toe surgery     Family History  Problem Relation Age of Onset  . Heart attack Father   . Stroke Father   . Heart failure Mother   . Heart failure Mother   . Diabetes  Mother   . Diabetes Brother   . Hypertension     History  Substance Use Topics  . Smoking status: Never Smoker   . Smokeless tobacco: Not on file  . Alcohol Use: No    Review of Systems  10 Systems reviewed and are negative for acute change except as noted in the HPI.     Allergies  Iodine  Home Medications   Prior to Admission medications   Medication Sig Start Date End Date Taking? Authorizing Provider  aspirin 325 MG tablet Take 325 mg by mouth daily.    Historical Provider, MD  buPROPion (WELLBUTRIN SR) 100 MG 12 hr tablet Take 100 mg by mouth 2 (two) times daily.    Historical Provider, MD  busPIRone (BUSPAR) 10 MG tablet Take 10 mg by mouth 3 (three) times daily.    Historical Provider, MD  cephALEXin (KEFLEX) 500 MG capsule Take 1 capsule (500 mg total) by mouth 4 (four) times daily. 01/14/15   Marlon Pel, PA-C  Ergocalciferol (VITAMIN D2) 2000 UNITS TABS Take 2,000 Units by mouth.    Historical Provider, MD  gabapentin (NEURONTIN) 400 MG capsule Take 400 mg by mouth 3 (three) times daily.    Historical Provider, MD  insulin regular human CONCENTRATED (HUMULIN R) 500 UNIT/ML SOLN injection Inject into the skin continuous.    Historical Provider, MD  isosorbide mononitrate (IMDUR) 60 MG 24 hr tablet  Take 60 mg by mouth 2 (two) times daily.    Historical Provider, MD  metFORMIN (GLUCOPHAGE) 1000 MG tablet Take 1,000 mg by mouth 2 (two) times daily with a meal.    Historical Provider, MD  metoprolol (LOPRESSOR) 50 MG tablet Take 50 mg by mouth 2 (two) times daily.    Historical Provider, MD  ranolazine (RANEXA) 500 MG 12 hr tablet Take 500 mg by mouth 2 (two) times daily.    Historical Provider, MD  simvastatin (ZOCOR) 80 MG tablet Take 80 mg by mouth at bedtime.    Historical Provider, MD   BP 118/73 mmHg  Pulse 63  Temp(Src) 97.6 F (36.4 C) (Oral)  Resp 18  Ht 6' (1.829 m)  Wt 255 lb (115.667 kg)  BMI 34.58 kg/m2  SpO2 100% Physical Exam  Constitutional: He  appears well-developed and well-nourished. No distress.  HENT:  Head: Normocephalic and atraumatic.  Eyes: Pupils are equal, round, and reactive to light.  Neck: Normal range of motion. Neck supple.  Cardiovascular: Normal rate and regular rhythm.   Pulmonary/Chest: Effort normal.  Abdominal: Soft.  Musculoskeletal:       Left foot: There is deformity ( the left great toenail is lifted up and hanging towards the side.). There is normal range of motion, no tenderness, no bony tenderness, no swelling, normal capillary refill, no crepitus and no laceration.       Feet:  FROM of great toe, no crepitus, laxity of joint or boney deformities. CR < 2 seconds. No ulceration within the nail bed.  Neurological: He is alert.  Skin: Skin is warm and dry.  Nursing note and vitals reviewed.   ED Course  NAIL REMOVAL Date/Time: 01/14/2015 8:26 PM Performed by: Marlon Pel Authorized by: Marlon Pel Consent: Verbal consent obtained. Risks and benefits: risks, benefits and alternatives were discussed Consent given by: patient Patient understanding: patient states understanding of the procedure being performed Patient identity confirmed: verbally with patient Location: left foot Location details: left big toe Patient sedated: no Preparation: skin prepped with alcohol Amount removed: complete Nail bed sutured: no Nail matrix removed: complete Dressing: antibiotic ointment, non-adhesive packing strip and 4x4 Patient tolerance: Patient tolerated the procedure well with no immediate complications Comments: Pt required no sedation due to lack of feeling in foot because of diabetic neuropathy.   (including critical care time) Labs Review Labs Reviewed - No data to display  Imaging Review No results found.   EKG Interpretation None      MDM   Final diagnoses:  Type 2 diabetes mellitus with diabetic nephropathy  Traumatic loss of toenail, left, initial encounter   No  complications with removal of nail.- no bleeding.   The patient and his wife are very diligent with taking care of his feet. They understand the risk of an open wound on the foot. We'll give prophylactic anabiotic and they  Patient has a podiatrist that he will follow-up with and plans to call the office tomorrow.  Medications  cephALEXin (KEFLEX) capsule 500 mg (not administered)   Pt believes he has had a tetanus, will call PCP tomorrow to confirm. 58 y.o.Jordan Burns evaluation in the Emergency Department is complete. It has been determined that no acute conditions requiring further emergency intervention are present at this time. The patient/guardian have been advised of the diagnosis and plan. We have discussed signs and symptoms that warrant return to the ED, such as changes or worsening in symptoms.  Vital signs are stable at discharge.  Filed Vitals:   01/14/15 1848  BP: 118/73  Pulse: 63  Temp: 97.6 F (36.4 C)  Resp: 18    Patient/guardian has voiced understanding and agreed to follow-up with the PCP or specialist.     Marlon Pel, PA-C 01/14/15 2029  Marlon Pel, PA-C 01/14/15 2034  Purvis Sheffield, MD 01/15/15 0010

## 2015-01-15 MED ORDER — SULFAMETHOXAZOLE-TRIMETHOPRIM 800-160 MG PO TABS: 1.0000 | ORAL_TABLET | Freq: Two times a day (BID) | ORAL | Status: AC

## 2015-01-15 NOTE — ED Provider Notes (Signed)
Bactrim DS 1 tab BID for 7 days, disp 14# called in to pharmacy today to: [address], [place], [place] [address]  [email or web address]  [phone]   Patient notified and aware of additional antibiotic being added to his Keflex after traumatic loss of left great toenail in the setting of significant diabetic peripheral neuropathy.  Patient has already called his podiatrist for follow-up and is currently at the [facility] for a medical visit.     Marlon Pel, PA-C 01/15/15 1329  Eber Hong, MD 01/15/15 2329

## 2015-11-25 ENCOUNTER — Emergency Department (HOSPITAL_BASED_OUTPATIENT_CLINIC_OR_DEPARTMENT_OTHER): Payer: Non-veteran care

## 2015-11-25 ENCOUNTER — Emergency Department (HOSPITAL_BASED_OUTPATIENT_CLINIC_OR_DEPARTMENT_OTHER)
Admission: EM | Admit: 2015-11-25 | Discharge: 2015-11-26 | Disposition: A | Discharge status: Home / Self Care | Payer: Non-veteran care | Attending: Emergency Medicine | Admitting: Emergency Medicine

## 2015-11-25 ENCOUNTER — Encounter (HOSPITAL_BASED_OUTPATIENT_CLINIC_OR_DEPARTMENT_OTHER): Payer: Self-pay

## 2015-11-25 DIAGNOSIS — I110 Hypertensive heart disease with heart failure: Secondary | ICD-10-CM | POA: Diagnosis not present

## 2015-11-25 DIAGNOSIS — I959 Hypotension, unspecified: Secondary | ICD-10-CM | POA: Diagnosis not present

## 2015-11-25 DIAGNOSIS — Z7984 Long term (current) use of oral hypoglycemic drugs: Secondary | ICD-10-CM | POA: Insufficient documentation

## 2015-11-25 DIAGNOSIS — Z7982 Long term (current) use of aspirin: Secondary | ICD-10-CM | POA: Insufficient documentation

## 2015-11-25 DIAGNOSIS — Z79899 Other long term (current) drug therapy: Secondary | ICD-10-CM | POA: Insufficient documentation

## 2015-11-25 DIAGNOSIS — I509 Heart failure, unspecified: Secondary | ICD-10-CM | POA: Diagnosis not present

## 2015-11-25 DIAGNOSIS — Z794 Long term (current) use of insulin: Secondary | ICD-10-CM | POA: Insufficient documentation

## 2015-11-25 DIAGNOSIS — R42 Dizziness and giddiness: Secondary | ICD-10-CM | POA: Diagnosis present

## 2015-11-25 DIAGNOSIS — E119 Type 2 diabetes mellitus without complications: Secondary | ICD-10-CM | POA: Diagnosis not present

## 2015-11-25 DIAGNOSIS — R55 Syncope and collapse: Secondary | ICD-10-CM

## 2015-11-25 LAB — BASIC METABOLIC PANEL
ANION GAP: 9 (ref 5–15)
BUN: 22 mg/dL — AB (ref 6–20)
CHLORIDE: 101 mmol/L (ref 101–111)
CO2: 27 mmol/L (ref 22–32)
Calcium: 8.6 mg/dL — ABNORMAL LOW (ref 8.9–10.3)
Creatinine, Ser: 1.2 mg/dL (ref 0.61–1.24)
GFR calc Af Amer: >60 mL/min (ref >60)
GFR calc non Af Amer: >60 mL/min (ref >60)
GLUCOSE: 138 mg/dL — AB (ref 65–99)
POTASSIUM: 4.3 mmol/L (ref 3.5–5.1)
Sodium: 137 mmol/L (ref 135–145)

## 2015-11-25 LAB — CBC
HEMATOCRIT: 44.8 % (ref 39.0–52.0)
HEMOGLOBIN: 15.4 g/dL (ref 13.0–17.0)
MCH: 31 pg (ref 26.0–34.0)
MCHC: 34.4 g/dL (ref 30.0–36.0)
MCV: 90.3 fL (ref 78.0–100.0)
Platelets: 183 10*3/uL (ref 150–400)
RBC: 4.96 MIL/uL (ref 4.22–5.81)
RDW: 13.8 % (ref 11.5–15.5)
WBC: 6.8 10*3/uL (ref 4.0–10.5)

## 2015-11-25 LAB — CBG MONITORING, ED: GLUCOSE-CAPILLARY: 129 mg/dL — AB (ref 65–99)

## 2015-11-25 LAB — TROPONIN I: Troponin I: <0.03 ng/mL (ref <0.031)

## 2015-11-25 MED ORDER — SODIUM CHLORIDE 0.9 % IV BOLUS (SEPSIS)
1000.0000 mL | Freq: Once | INTRAVENOUS | Status: AC
  Administered 2015-11-25: 1000 mL via INTRAVENOUS

## 2015-11-25 MED ORDER — MECLIZINE HCL 25 MG PO TABS
25.0000 mg | ORAL_TABLET | Freq: Once | ORAL | Status: AC
  Administered 2015-11-25: 25 mg via ORAL
  Filled 2015-11-25: qty 1

## 2015-11-25 MED ORDER — ASPIRIN 81 MG PO CHEW
324.0000 mg | CHEWABLE_TABLET | Freq: Once | ORAL | Status: DC
  Filled 2015-11-25: qty 4

## 2015-11-25 NOTE — ED Notes (Signed)
Pt placed on auto vitals Q30. Patient placed on cardiac monitor.  

## 2015-11-25 NOTE — ED Provider Notes (Signed)
CSN: [id number]     Arrival date & time 11/25/15  2023 History   First MD Initiated Contact with Patient 11/25/15 2033     Chief Complaint  Patient presents with  . Dizziness     (Consider location/radiation/quality/duration/timing/severity/associated sxs/prior Treatment) HPI Jordan Burns is a(n) 60 y.o. male who presents to the ED for cc of presyncope and and hypotension. The patient states that around 7:30 pm he went to take a shower when he got suddenly light headed and weak. He laid back down on his bed. His wife took his blood pressure and noticed he had a systolic pressure of 85. [facility] who said to go To the Emergency Department. He Is Not Taking Any New Medications, Has Not Had Any Changes to His Medications. He Takes One Agent for Blood Pressure Lowering. She Denies Any Melena or Hematochezia, He Denies Recent Volume Loss. He Denies Chest Pain, Shortness of Breath, Racing or Skipping in His Heart. He Had One Episode Similar to This Earlier in the Week That He Did Not Tell His Wife about. He Denies Fevers, Chills, Nausea, Vomiting, Abdominal Pain. His Wife States That during the Episode of Dizziness and High Peak Hypertension. He Seemed to Get Very Pale and Diaphoretic. He Has a past Medical History of Coronary Artery Disease, Status Post CABG along with Insulin-Dependent Diabetes, Hypertension, High Cholesterol, Peripheral Neuropathy. He Has an Ulcer on the Left Foot, Which Was Checked by His Podiatrist Today and Is Well-Healing. No sxs of dvt.  Past Medical History  Diagnosis Date  . Diabetes mellitus   . Hypertension   . High cholesterol   . CHF (congestive heart failure) (HCC)   . OSA (obstructive sleep apnea)   . CAD (coronary artery disease)   . GERD (gastroesophageal reflux disease)   . Peripheral neuropathy [facility](HCC)    Past Surgical History  Procedure Laterality Date  . Coronary artery bypass graft    . Carpal tunnel release    . Toe surgery    .  Hammer toe surgery     Family History  Problem Relation Age of Onset  . Heart attack Father   . Stroke Father   . Heart failure Mother   . Heart failure Mother   . Diabetes Mother   . Diabetes Brother   . Hypertension     Social History  Substance Use Topics  . Smoking status: Never Smoker   . Smokeless tobacco: None  . Alcohol Use: No    Review of Systems  Ten systems reviewed and are negative for acute change, except as noted in the HPI.    Allergies  Iodine  Home Medications   Prior to Admission medications   Medication Sig Start Date End Date Taking? Authorizing Provider  aspirin 325 MG tablet Take 325 mg by mouth daily.   Yes Historical Provider, MD  busPIRone (BUSPAR) 10 MG tablet Take 10 mg by mouth 3 (three) times daily.   Yes Historical Provider, MD  Ergocalciferol (VITAMIN D2) 2000 UNITS TABS Take 2,000 Units by mouth.   Yes Historical Provider, MD  gabapentin (NEURONTIN) 400 MG capsule Take 600 mg by mouth 3 (three) times daily.    Yes Historical Provider, MD  insulin regular human CONCENTRATED (HUMULIN R) 500 UNIT/ML SOLN injection Inject into the skin continuous.   Yes Historical Provider, MD  isosorbide mononitrate (IMDUR) 60 MG 24 hr tablet Take 60 mg by mouth 2 (two) times daily.   Yes Historical Provider, MD  metFORMIN (GLUCOPHAGE) 1000 MG tablet Take 1,000 mg by mouth 2 (two) times daily with a meal.   Yes Historical Provider, MD  metoprolol (LOPRESSOR) 50 MG tablet Take 50 mg by mouth 2 (two) times daily.    Yes Historical Provider, MD  Omega-3 Fatty Acids (FISH OIL) 1000 MG CAPS Take by mouth.   Yes Historical Provider, MD  ranolazine (RANEXA) 500 MG 12 hr tablet Take 1,000 mg by mouth daily.    Yes Historical Provider, MD  simvastatin (ZOCOR) 80 MG tablet Take 80 mg by mouth at bedtime.   Yes Historical Provider, MD  buPROPion (WELLBUTRIN SR) 100 MG 12 hr tablet Take 100 mg by mouth 2 (two) times daily.    Historical Provider, MD  cephALEXin (KEFLEX)  500 MG capsule Take 1 capsule (500 mg total) by mouth 4 (four) times daily. 01/14/15   Tiffany Neva Seat, PA-C   BP 108/67 mmHg  Pulse 65  Temp(Src) 97.8 F (36.6 C) (Oral)  Resp 14  Ht 6' (1.829 m)  Wt 117.935 kg  BMI 35.25 kg/m2  SpO2 99% Physical Exam  Constitutional: He appears well-developed and well-nourished. No distress.  HENT:  Head: Normocephalic and atraumatic.  Eyes: Conjunctivae are normal. No scleral icterus.  Neck: Normal range of motion. Neck supple.  Cardiovascular: Normal rate, regular rhythm and normal heart sounds.   Pulmonary/Chest: Effort normal and breath sounds normal. No respiratory distress.  Abdominal: Soft. There is no tenderness.  Musculoskeletal: He exhibits no edema.  Neurological: He is alert.  Speech is clear and goal oriented, follows commands Major Cranial nerves without deficit, no facial droop Normal strength in upper and lower extremities bilaterally including dorsiflexion and plantar flexion, strong and equal grip strength Sensation normal to light and sharp touch Moves extremities without ataxia, coordination intact Normal finger to nose and rapid alternating movements Neg romberg, no pronator drift Normal gait Normal heel-shin and balance   Skin: Skin is warm and dry. He is not diaphoretic.  Psychiatric: His behavior is normal.  Nursing note and vitals reviewed.   ED Course  Procedures (including critical care time) Labs Review Labs Reviewed  BASIC METABOLIC PANEL - Abnormal; Notable for the following:    Glucose, Bld 138 (*)    BUN 22 (*)    Calcium 8.6 (*)    All other components within normal limits  CBG MONITORING, ED - Abnormal; Notable for the following:    Glucose-Capillary 129 (*)    All other components within normal limits  CBC  TROPONIN I  TROPONIN I  CBG MONITORING, ED    Imaging Review Dg Chest 2 View  11/25/2015  CLINICAL DATA:  Dizziness and weakness EXAM: CHEST  2 VIEW COMPARISON:  12/21/2013 FINDINGS:  Cardiac shadow is within normal limits. Lungs are well aerated bilaterally. Postsurgical changes are noted. No acute bony abnormality is seen. IMPRESSION: No acute abnormality noted. Electronically Signed   By: Alcide Clever M.D.   On: 11/25/2015 21:29   I have personally reviewed and evaluated these images and lab results as part of my medical decision-making.   EKG Interpretation   Date/Time:  Thursday November 25 2015 20:36:41 EDT Ventricular Rate:  70 PR Interval:  311 QRS Duration: 145 QT Interval:  421 QTC Calculation: 454 R Axis:   -44 Text Interpretation:  Sinus rhythm Prolonged PR interval Probable left  atrial enlargement IVCD, consider atypical RBBB Inferior infarct, old  Baseline wander in lead(s) V1 Confirmed by Ranae Palms  MD, DAVID ([id number]) on  11/26/2015  5:22:13 PM      MDM   Final diagnoses:  Pre-syncope  Hypotension, unspecified hypotension type    Patient without cp, sob, nausea or vomiting. Although he has a cardiac history,he is cp free without signs of ischemia on EKG Patient given fluids, imging and CXR are without acute abnormality.  Patient With 2 negative troponins. His dizziness is resolved after fluids and meclizine.  There does not appear to be an emergent cause of his sxs such as aortic dissection, Doubt PE, ACS, gi bleed ans hgb w/in normal limits. Patient is not on anticoagulation meds. Patient ambulated in the ED without difficulty. He has negative ortthostatics. APpears safe for discharge at this time. I have discussed lab and/or imaging findings with the patient and answered all questions/concerns to the best of my ability. I have spoken with Dr. Fayrene Fearing about the case and included him in the MDM. He agrees with work up and plan of care to include discharge with close follow up. He is to fdollow up with his pcp at the [place], He may need holter monitoring, medication review. Discussed possibility of dysautonomia.     Arthor Captain, PA-C 11/29/15 1610  Rolland Porter, MD 12/11/15 [phone]

## 2015-11-25 NOTE — ED Notes (Signed)
Pt c/o dizziness and weakness that started about thirty minutes ago with low blood pressure and diaphoresis.  Pt denies chest pain, denies SOB, no numbness or weakness or confusion, blood sugar is 129 in triage.

## 2015-11-26 LAB — TROPONIN I

## 2015-11-26 NOTE — Discharge Instructions (Signed)
Hypotension °As your heart beats, it forces blood through your arteries. This force is your blood pressure. If your blood pressure is too low for you to go about your normal activities or to support the organs of your body, you have hypotension. Hypotension is also referred to as low blood pressure. When your blood pressure becomes too low, you may not get enough blood to your brain. As a result, you may feel weak, feel lightheaded, or develop a rapid heart rate. In a more severe case, you may faint. °CAUSES °Various conditions can cause hypotension. These include: °· Blood loss. °· Dehydration. °· Heart or endocrine problems. °· Pregnancy. °· Severe infection. °· Not having a well-balanced diet filled with needed nutrients. °· Severe allergic reactions (anaphylaxis). °Some medicines, such as blood pressure medicine or water pills (diuretics), may lower your blood pressure below normal. Sometimes taking too much medicine or taking medicine not as directed can cause hypotension. °TREATMENT  °Hospitalization is sometimes required for hypotension if fluid or blood replacement is needed, if time is needed for medicines to wear off, or if further monitoring is needed. Treatment might include changing your diet, changing your medicines (including medicines aimed at raising your blood pressure), and use of support stockings. °HOME CARE INSTRUCTIONS  °· Drink enough fluids to keep your urine clear or pale yellow. °· Take your medicines as directed by your health care provider. °· Get up slowly from reclining or sitting positions. This gives your blood pressure a chance to adjust. °· Wear support stockings as directed by your health care provider. °· Maintain a healthy diet by including nutritious food, such as fruits, vegetables, nuts, whole grains, and lean meats. °SEEK MEDICAL CARE IF: °· You have vomiting or diarrhea. °· You have a fever for more than 2-3 days. °· You feel more thirsty than usual. °· You feel weak and  tired. °SEEK IMMEDIATE MEDICAL CARE IF:  °· You have chest pain or a fast or irregular heartbeat. °· You have a loss of feeling in some part of your body, or you lose movement in your arms or legs. °· You have trouble speaking. °· You become sweaty or feel lightheaded. °· You faint. °MAKE SURE YOU:  °· Understand these instructions. °· Will watch your condition. °· Will get help right away if you are not doing well or get worse. °  °This information is not intended to replace advice given to you by your health care provider. Make sure you discuss any questions you have with your health care provider. °  °Document Released: 06/05/2005 Document Revised: 03/26/2013 Document Reviewed: 12/06/2012 °Elsevier Interactive Patient Education ©2016 [employer] ° °Near-Syncope °Near-syncope (commonly known as near fainting) is sudden weakness, dizziness, or feeling like you might pass out. During an episode of near-syncope, you may also develop pale skin, have tunnel vision, or feel sick to your stomach (nauseous). Near-syncope may occur when getting up after sitting or while standing for a long time. It is caused by a sudden decrease in blood flow to the brain. This decrease can result from various causes or triggers, most of which are not serious. However, because near-syncope can sometimes be a sign of something serious, a medical evaluation is required. The specific cause is often not determined. °HOME CARE INSTRUCTIONS  °Monitor your condition for any changes. The following actions may help to alleviate any discomfort you are experiencing: °· Have someone stay with you until you feel stable. °· Lie down right away and prop your feet   up if you start feeling like you might faint. Breathe deeply and steadily. Wait until all the symptoms have passed. Most of these episodes last only a few minutes. You may feel tired for several hours.   °· Drink enough fluids to keep your urine clear or pale yellow.   °· If you are taking  blood pressure or heart medicine, get up slowly when seated or lying down. Take several minutes to sit and then stand. This can reduce dizziness. °· Follow up with your health care provider as directed.  °SEEK IMMEDIATE MEDICAL CARE IF:  °· You have a severe headache.   °· You have unusual pain in the chest, abdomen, or back.   °· You are bleeding from the mouth or rectum, or you have black or tarry stool.   °· You have an irregular or very fast heartbeat.   °· You have repeated fainting or have seizure-like jerking during an episode.   °· You faint when sitting or lying down.   °· You have confusion.   °· You have difficulty walking.   °· You have severe weakness.   °· You have vision problems.   °MAKE SURE YOU:  °· Understand these instructions. °· Will watch your condition. °· Will get help right away if you are not doing well or get worse. °  °This information is not intended to replace advice given to you by your health care provider. Make sure you discuss any questions you have with your health care provider. °  °Document Released: 06/05/2005 Document Revised: 06/10/2013 Document Reviewed: 11/08/2012 °Elsevier Interactive Patient Education ©2016 [employer] ° °

## 2015-11-26 NOTE — ED Notes (Signed)
Pt verbalizes understanding of d/c instructions and denies any further needs at this time. 

## 2021-12-01 ENCOUNTER — Encounter (HOSPITAL_BASED_OUTPATIENT_CLINIC_OR_DEPARTMENT_OTHER): Payer: Self-pay | Admitting: Urology

## 2021-12-01 ENCOUNTER — Emergency Department (HOSPITAL_BASED_OUTPATIENT_CLINIC_OR_DEPARTMENT_OTHER): Payer: No Typology Code available for payment source

## 2021-12-01 ENCOUNTER — Other Ambulatory Visit: Payer: Self-pay

## 2021-12-01 ENCOUNTER — Emergency Department (HOSPITAL_BASED_OUTPATIENT_CLINIC_OR_DEPARTMENT_OTHER)
Admission: EM | Admit: 2021-12-01 | Discharge: 2021-12-01 | Disposition: A | Discharge status: Home / Self Care | Payer: No Typology Code available for payment source | Attending: Emergency Medicine | Admitting: Emergency Medicine

## 2021-12-01 DIAGNOSIS — S81801A Unspecified open wound, right lower leg, initial encounter: Secondary | ICD-10-CM | POA: Diagnosis not present

## 2021-12-01 DIAGNOSIS — I110 Hypertensive heart disease with heart failure: Secondary | ICD-10-CM | POA: Insufficient documentation

## 2021-12-01 DIAGNOSIS — Z7982 Long term (current) use of aspirin: Secondary | ICD-10-CM | POA: Diagnosis not present

## 2021-12-01 DIAGNOSIS — R1010 Upper abdominal pain, unspecified: Secondary | ICD-10-CM | POA: Insufficient documentation

## 2021-12-01 DIAGNOSIS — W01198A Fall on same level from slipping, tripping and stumbling with subsequent striking against other object, initial encounter: Secondary | ICD-10-CM | POA: Insufficient documentation

## 2021-12-01 DIAGNOSIS — S81802A Unspecified open wound, left lower leg, initial encounter: Secondary | ICD-10-CM | POA: Diagnosis not present

## 2021-12-01 DIAGNOSIS — Z79899 Other long term (current) drug therapy: Secondary | ICD-10-CM | POA: Insufficient documentation

## 2021-12-01 DIAGNOSIS — Y92007 Garden or yard of unspecified non-institutional (private) residence as the place of occurrence of the external cause: Secondary | ICD-10-CM | POA: Diagnosis not present

## 2021-12-01 DIAGNOSIS — Z7984 Long term (current) use of oral hypoglycemic drugs: Secondary | ICD-10-CM | POA: Insufficient documentation

## 2021-12-01 DIAGNOSIS — E119 Type 2 diabetes mellitus without complications: Secondary | ICD-10-CM | POA: Diagnosis not present

## 2021-12-01 DIAGNOSIS — Z794 Long term (current) use of insulin: Secondary | ICD-10-CM | POA: Diagnosis not present

## 2021-12-01 DIAGNOSIS — I2510 Atherosclerotic heart disease of native coronary artery without angina pectoris: Secondary | ICD-10-CM | POA: Diagnosis not present

## 2021-12-01 DIAGNOSIS — S51011A Laceration without foreign body of right elbow, initial encounter: Secondary | ICD-10-CM | POA: Insufficient documentation

## 2021-12-01 DIAGNOSIS — I509 Heart failure, unspecified: Secondary | ICD-10-CM | POA: Insufficient documentation

## 2021-12-01 DIAGNOSIS — S0990XA Unspecified injury of head, initial encounter: Secondary | ICD-10-CM | POA: Diagnosis not present

## 2021-12-01 DIAGNOSIS — Z950 Presence of cardiac pacemaker: Secondary | ICD-10-CM | POA: Insufficient documentation

## 2021-12-01 DIAGNOSIS — Z955 Presence of coronary angioplasty implant and graft: Secondary | ICD-10-CM | POA: Insufficient documentation

## 2021-12-01 DIAGNOSIS — T148XXA Other injury of unspecified body region, initial encounter: Secondary | ICD-10-CM

## 2021-12-01 DIAGNOSIS — W19XXXA Unspecified fall, initial encounter: Secondary | ICD-10-CM

## 2021-12-01 DIAGNOSIS — S8991XA Unspecified injury of right lower leg, initial encounter: Secondary | ICD-10-CM | POA: Diagnosis present

## 2021-12-01 LAB — CBC
HCT: 45.9 % (ref 39.0–52.0)
Hemoglobin: 15.6 g/dL (ref 13.0–17.0)
MCH: 29.8 pg (ref 26.0–34.0)
MCHC: 34 g/dL (ref 30.0–36.0)
MCV: 87.6 fL (ref 80.0–100.0)
Platelets: 170 10*3/uL (ref 150–400)
RBC: 5.24 MIL/uL (ref 4.22–5.81)
RDW: 14.3 % (ref 11.5–15.5)
WBC: 6.9 10*3/uL (ref 4.0–10.5)
nRBC: 0 % (ref 0.0–0.2)

## 2021-12-01 LAB — COMPREHENSIVE METABOLIC PANEL
ALT: 26 U/L (ref 0–44)
AST: 27 U/L (ref 15–41)
Albumin: 4.3 g/dL (ref 3.5–5.0)
Alkaline Phosphatase: 53 U/L (ref 38–126)
Anion gap: 11 (ref 5–15)
BUN: 23 mg/dL (ref 8–23)
CO2: 24 mmol/L (ref 22–32)
Calcium: 8.8 mg/dL — ABNORMAL LOW (ref 8.9–10.3)
Chloride: 102 mmol/L (ref 98–111)
Creatinine, Ser: 1.26 mg/dL — ABNORMAL HIGH (ref 0.61–1.24)
GFR, Estimated: >60 mL/min (ref >60)
Glucose, Bld: 199 mg/dL — ABNORMAL HIGH (ref 70–99)
Potassium: 4.1 mmol/L (ref 3.5–5.1)
Sodium: 137 mmol/L (ref 135–145)
Total Bilirubin: 1 mg/dL (ref 0.3–1.2)
Total Protein: 7.7 g/dL (ref 6.5–8.1)

## 2021-12-01 LAB — LIPASE, BLOOD: Lipase: 29 U/L (ref 11–51)

## 2021-12-01 MED ORDER — METHOCARBAMOL 500 MG PO TABS: 500.0000 mg | ORAL_TABLET | Freq: Two times a day (BID) | ORAL | 0 refills | Status: AC

## 2021-12-01 MED ORDER — MORPHINE SULFATE (PF) 4 MG/ML IV SOLN
4.0000 mg | Freq: Once | INTRAVENOUS | Status: AC
  Administered 2021-12-01: 4 mg via INTRAVENOUS
  Filled 2021-12-01: qty 1

## 2021-12-01 MED ORDER — SODIUM CHLORIDE 0.9 % IV BOLUS
1000.0000 mL | Freq: Once | INTRAVENOUS | Status: AC
  Administered 2021-12-01: 1000 mL via INTRAVENOUS

## 2021-12-01 MED ORDER — IOHEXOL 300 MG/ML  SOLN
100.0000 mL | Freq: Once | INTRAMUSCULAR | Status: AC | PRN
  Administered 2021-12-01: 100 mL via INTRAVENOUS

## 2021-12-01 MED ORDER — HYDROCODONE-ACETAMINOPHEN 5-325 MG PO TABS
1.0000 | ORAL_TABLET | Freq: Once | ORAL | Status: AC
  Administered 2021-12-01: 1 via ORAL
  Filled 2021-12-01: qty 1

## 2021-12-01 MED ORDER — ONDANSETRON HCL 4 MG/2ML IJ SOLN
4.0000 mg | Freq: Once | INTRAMUSCULAR | Status: AC
  Administered 2021-12-01: 4 mg via INTRAVENOUS
  Filled 2021-12-01: qty 2

## 2021-12-01 MED ORDER — IBUPROFEN 600 MG PO TABS: 600.0000 mg | ORAL_TABLET | Freq: Four times a day (QID) | ORAL | 0 refills | Status: AC | PRN

## 2021-12-01 MED ORDER — ACETAMINOPHEN 325 MG PO TABS: 650.0000 mg | ORAL_TABLET | Freq: Four times a day (QID) | ORAL | 0 refills | Status: AC | PRN

## 2021-12-01 NOTE — ED Triage Notes (Signed)
Fall backwards in yard today from ground level  States hitting head or LOC  Right elbow abrasion  States abdominal pain since fall   States multiple fall recently due to neuropathy

## 2021-12-01 NOTE — ED Notes (Signed)
Patient transported to CT 

## 2021-12-01 NOTE — ED Notes (Signed)
Rt elbow cleaned with wound cleaner, bacitracin placed on elbow with nonstick pad and secured with coban

## 2021-12-01 NOTE — Discharge Instructions (Addendum)
It was a pleasure caring for you today in the emergency department. ° °Please return to the emergency department for any worsening or worrisome symptoms. ° ° °

## 2021-12-01 NOTE — ED Notes (Signed)
Patient given discharge instructions, all questions answered. Patient in possession of all belongings, directed to the discharge area  

## 2021-12-01 NOTE — ED Provider Notes (Signed)
[facility] EMERGENCY DEPARTMENT Provider Note   CSN: [id number] Arrival date & time: 12/01/21  1518     History {Add pertinent medical, surgical, social history, OB history to HPI:1} Chief Complaint  Patient presents with   Jordan Burns is a 67 y.o. male.   Patient as above with significant medical history as below, including CAD, CHF, neuropathy, HTN, HLD who presents to the ED with complaint of fall. Pt reports around 10-11am this am he suffered an outdoor fall, fell onto concrete on a sloped hill. Unsure of head injury, denies LOC. He struck his right elbow and has been experiencing excruciating pain to abdomen across upper quadrants, no nausea or vomiting, no hematuria or BRBPR, took aleve x1 w/o improvement. Tetanus is UTD. No HA, no numbness or tingling, no gait disturbance since the fall. No change to bowel/bladder fxn.  No thinners.     Past Medical History: No date: CAD (coronary artery disease) No date: CHF (congestive heart failure) (HCC) No date: Diabetes mellitus No date: GERD (gastroesophageal reflux disease) No date: High cholesterol No date: Hypertension No date: OSA (obstructive sleep apnea) No date: Peripheral neuropathy  Past Surgical History: No date: CARPAL TUNNEL RELEASE No date: CORONARY ARTERY BYPASS GRAFT No date: HAMMER TOE SURGERY No date: PACEMAKER INSERTION No date: TOE SURGERY    The history is provided by the patient and a relative. No language interpreter was used.  Fall Associated symptoms include abdominal pain. Pertinent negatives include no chest pain, no headaches and no shortness of breath.       Home Medications Prior to Admission medications   Medication Sig Start Date End Date Taking? Authorizing Provider  aspirin 325 MG tablet Take 325 mg by mouth daily.    [provider]  buPROPion (WELLBUTRIN SR) 100 MG 12 hr tablet Take 100 mg by mouth 2 (two) times daily.    [provider]   busPIRone (BUSPAR) 10 MG tablet Take 10 mg by mouth 3 (three) times daily.    [provider]  cephALEXin (KEFLEX) 500 MG capsule Take 1 capsule (500 mg total) by mouth 4 (four) times daily. 01/14/15   Marlon Pel, PA-C  Ergocalciferol (VITAMIN D2) 2000 UNITS TABS Take 2,000 Units by mouth.    [provider]  gabapentin (NEURONTIN) 400 MG capsule Take 600 mg by mouth 3 (three) times daily.     [provider]  insulin regular human CONCENTRATED (HUMULIN R) 500 UNIT/ML SOLN injection Inject into the skin continuous.    [provider]  isosorbide mononitrate (IMDUR) 60 MG 24 hr tablet Take 60 mg by mouth 2 (two) times daily.    [provider]  metFORMIN (GLUCOPHAGE) 1000 MG tablet Take 1,000 mg by mouth 2 (two) times daily with a meal.    [provider]  metoprolol (LOPRESSOR) 50 MG tablet Take 50 mg by mouth 2 (two) times daily.     [provider]  Omega-3 Fatty Acids (FISH OIL) 1000 MG CAPS Take by mouth.    [provider]  ranolazine (RANEXA) 500 MG 12 hr tablet Take 1,000 mg by mouth daily.     [provider]  simvastatin (ZOCOR) 80 MG tablet Take 80 mg by mouth at bedtime.    [provider]      Allergies    Iodine    Review of Systems   Review of Systems  Constitutional:  Negative for chills and fever.  HENT:  Negative  for facial swelling and trouble swallowing.   Eyes:  Negative for photophobia and visual disturbance.  Respiratory:  Negative for cough and shortness of breath.   Cardiovascular:  Negative for chest pain and palpitations.  Gastrointestinal:  Positive for abdominal pain. Negative for nausea and vomiting.  Endocrine: Negative for polydipsia and polyuria.  Genitourinary:  Negative for difficulty urinating and hematuria.  Musculoskeletal:  Positive for arthralgias. Negative for gait problem and joint swelling.  Skin:  Negative for pallor and rash.  Neurological:  Negative  for syncope and headaches.  Psychiatric/Behavioral:  Negative for agitation and confusion.     Physical Exam Updated Vital Signs BP 137/76 (BP Location: Left Arm)   Pulse 74   Temp 97.9 F (36.6 C)   Resp (!) 21   Ht 6' (1.829 m)   Wt 117.9 kg   SpO2 99%   BMI 35.25 kg/m  Physical Exam Vitals and nursing note reviewed.  Constitutional:      General: He is not in acute distress.    Appearance: Normal appearance. He is well-developed. He is not ill-appearing or diaphoretic.  HENT:     Head: Normocephalic and atraumatic. No raccoon eyes, Battle's sign, right periorbital erythema or left periorbital erythema.     Jaw: There is normal jaw occlusion. No trismus.     Right Ear: External ear normal.     Left Ear: External ear normal.     Mouth/Throat:     Mouth: Mucous membranes are moist.  Eyes:     General: No scleral icterus.    Extraocular Movements: Extraocular movements intact.     Pupils: Pupils are equal, round, and reactive to light.  Cardiovascular:     Rate and Rhythm: Normal rate and regular rhythm.     Pulses: Normal pulses.          Radial pulses are 2+ on the right side and 2+ on the left side.       Posterior tibial pulses are 2+ on the right side and 2+ on the left side.     Heart sounds: Normal heart sounds.  Pulmonary:     Effort: Pulmonary effort is normal. No respiratory distress.     Breath sounds: Normal breath sounds.  Abdominal:     General: Abdomen is flat.     Palpations: Abdomen is soft.     Tenderness: There is abdominal tenderness. There is guarding.    Musculoskeletal:        General: Normal range of motion.     Cervical back: Normal range of motion.     Right lower leg: No edema.     Left lower leg: No edema.     Comments: Skin tear right elbow.  Upper extremities neurovascular intact to radial, ulnar median nerve distributions bilateral  2+ radial pulse bilateral  Skin:    General: Skin is warm and dry.     Capillary Refill: Capillary  refill takes less than 2 seconds.     Comments: Multiple scabs and wounds to his lower extremities bilateral.  Neurological:     Mental Status: He is alert and oriented to person, place, and time.     GCS: GCS eye subscore is 4. GCS verbal subscore is 5. GCS motor subscore is 6.     Cranial Nerves: Cranial nerves 2-12 are intact.     Sensory: Sensation is intact.     Motor: Motor function is intact.     Coordination: Coordination is intact.  Psychiatric:  Mood and Affect: Mood normal.        Behavior: Behavior normal.     ED Results / Procedures / Treatments   Labs (all labs ordered are listed, but only abnormal results are displayed) Labs Reviewed  BASIC METABOLIC PANEL  CBC    EKG None  Radiology No results found.  Procedures Procedures  {Document cardiac monitor, telemetry assessment procedure when appropriate:1}  Medications Ordered in ED Medications  ondansetron (ZOFRAN) injection 4 mg (has no administration in time range)  sodium chloride 0.9 % bolus 1,000 mL (has no administration in time range)  morphine (PF) 4 MG/ML injection 4 mg (has no administration in time range)    ED Course/ Medical Decision Making/ A&P                           Medical Decision Making Amount and/or Complexity of Data Reviewed Labs: ordered. Radiology: ordered.  Risk Prescription drug management.    CC: Follow, abdominal pain  This patient presents to the Emergency Department for the above complaint. This involves an extensive number of treatment options and is a complaint that carries with it a high risk of complications and morbidity. Vital signs were reviewed. Serious etiologies considered.   Differential diagnosis includes but is not exclusive to acute cholecystitis, intrathoracic causes for epigastric abdominal pain, gastritis, duodenitis, pancreatitis, small bowel or large bowel obstruction, abdominal aortic aneurysm, hernia, gastritis, intra-abdominal trauma,  intracranial trauma etc.   Record review:  Previous records obtained and reviewed care received at [facility] thus chart review is limited, PDMP was reviewed  Additional history obtained from spouse  Medical and surgical history as noted above.   Work up as above, notable for:  Labs & imaging results that were available during my care of the patient were visualized by me and considered in my medical decision making.   I ordered imaging studies which included ***. I visualized the imaging, interpreted images, and I agree with radiologist interpretation. ***  Cardiac monitoring reviewed and interpreted personally which shows ***   Personally discussed patient care with consultant; ***  Management: ***  Reassessment:  ***  Admission was considered.                Social determinants of health include -  Social History   Socioeconomic History   Marital status: Married    Spouse name: Not on file   Number of children: 0   Years of education: Not on file   Highest education level: Not on file  Occupational History   Not on file  Tobacco Use   Smoking status: Never   Smokeless tobacco: Not on file  Substance and Sexual Activity   Alcohol use: No   Drug use: No   Sexual activity: Not on file  Other Topics Concern   Not on file  Social History Narrative   Not on file   Social Determinants of Health   Financial Resource Strain: Not on file  Food Insecurity: Not on file  Transportation Needs: Not on file  Physical Activity: Not on file  Stress: Not on file  Social Connections: Not on file  Intimate Partner Violence: Not on file      This chart was dictated using voice recognition software.  Despite best efforts to proofread,  errors can occur which can change the documentation meaning.   {Document critical care time when appropriate:1} {Document review of labs and clinical decision tools ie heart score,  Chads2Vasc2 etc:1}  {Document your independent  review of radiology images, and any outside records:1} {Document your discussion with family members, caretakers, and with consultants:1} {Document social determinants of health affecting pt's care:1} {Document your decision making why or why not admission, treatments were needed:1} Final Clinical Impression(s) / ED Diagnoses Final diagnoses:  None    Rx / DC Orders ED Discharge Orders     None

## 2022-11-20 IMAGING — DX DG ELBOW COMPLETE 3+V*R*
4 series · 4 of 4 positions shown · non-contrast
Comparison: None Available.

CLINICAL DATA: Fell. Right elbow pain.

EXAM:
RIGHT ELBOW - COMPLETE 3+ VIEW

[elbow ap]
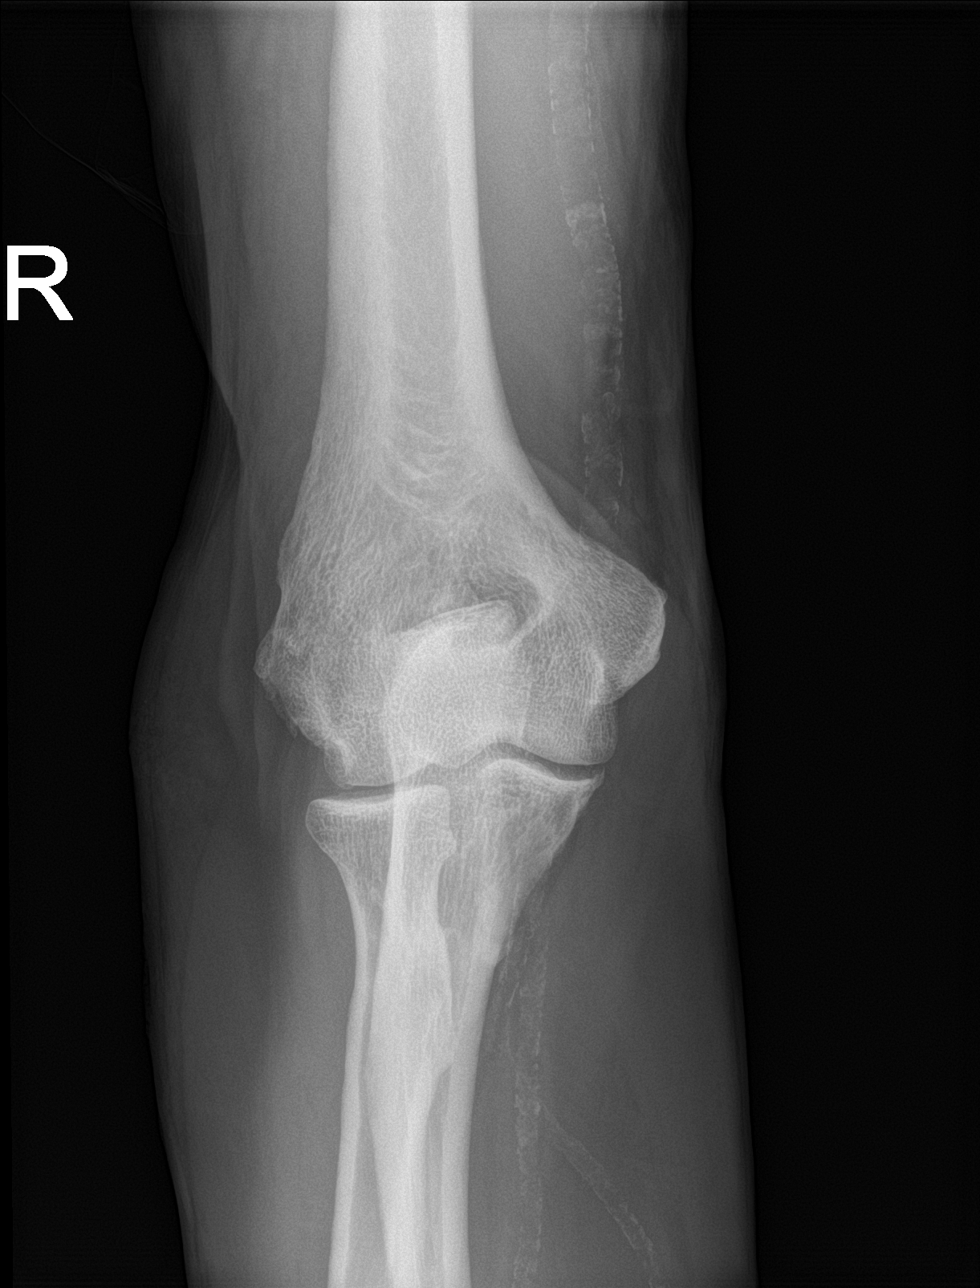

[elbow obl (1 of 2)]
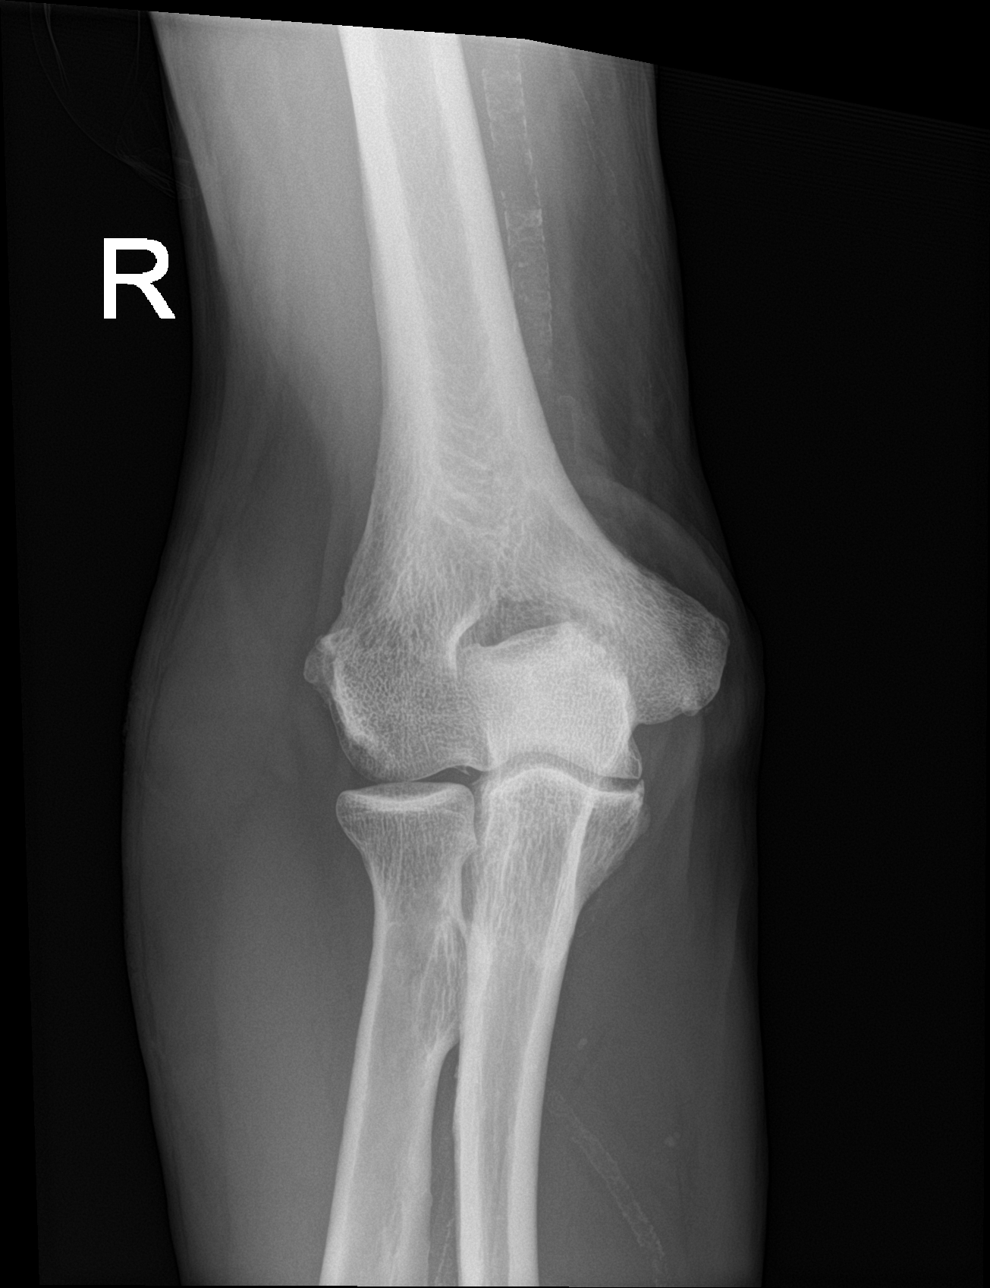

[elbow obl (2 of 2)]
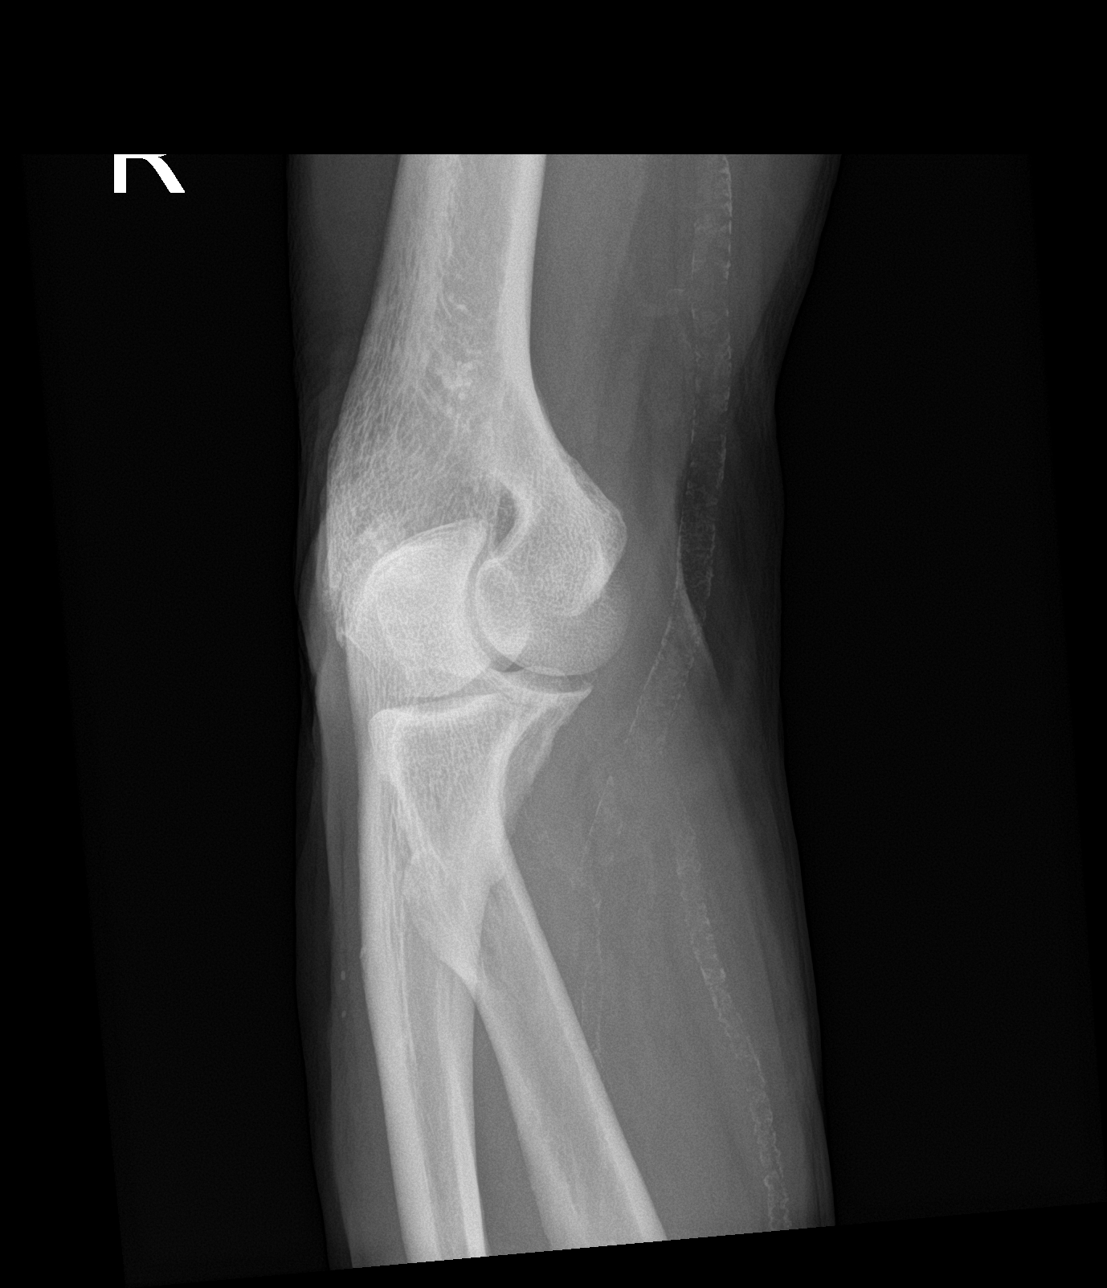

[elbow lat]
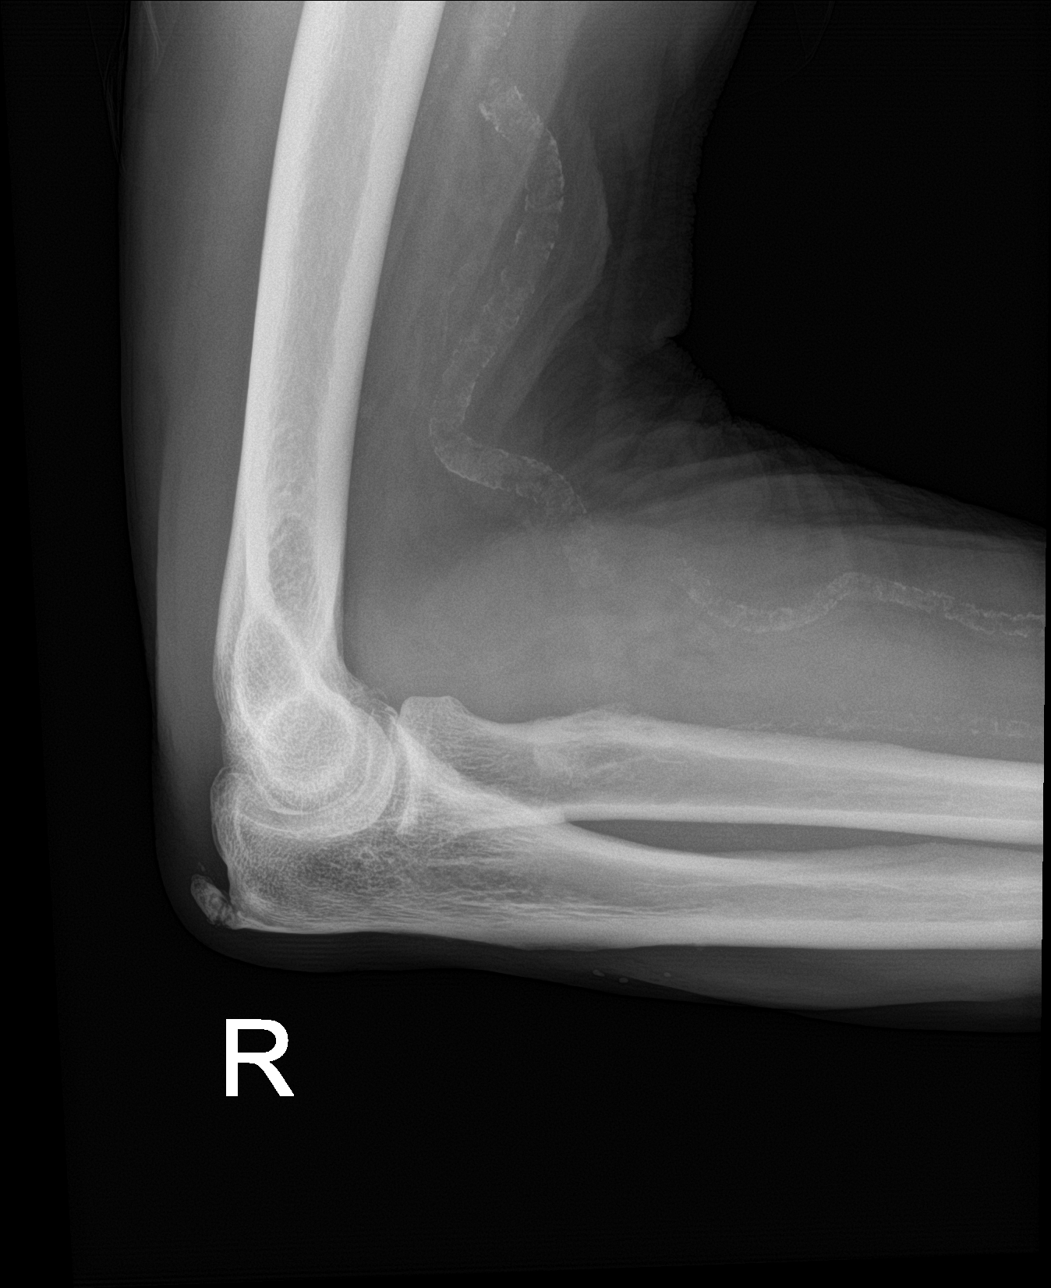

[4 of 4 positions shown; findings below may reference images not displayed]

FINDINGS: Mild elbow joint degenerative changes but no acute fracture. No
elbow joint effusion. A moderate-sized olecranon spur is noted.
Advanced vascular calcifications.
IMPRESSION: Mild elbow joint degenerative changes but no acute fracture or joint
effusion.
